# Patient Record
Sex: Male | Born: 1960 | Race: White | Hispanic: No | Smoking: Current every day smoker
Health system: Southern US, Community
[De-identification: ages and names within clinical notes are randomized; demographics above are authoritative.]

## PROBLEM LIST (undated history)

## (undated) DIAGNOSIS — N179 Acute kidney failure, unspecified: Secondary | ICD-10-CM

## (undated) DIAGNOSIS — M109 Gout, unspecified: Secondary | ICD-10-CM

## (undated) DIAGNOSIS — I10 Essential (primary) hypertension: Secondary | ICD-10-CM

## (undated) DIAGNOSIS — N451 Epididymitis: Secondary | ICD-10-CM

## (undated) DIAGNOSIS — K219 Gastro-esophageal reflux disease without esophagitis: Secondary | ICD-10-CM

## (undated) DIAGNOSIS — G8929 Other chronic pain: Secondary | ICD-10-CM

## (undated) DIAGNOSIS — M549 Dorsalgia, unspecified: Secondary | ICD-10-CM

## (undated) DIAGNOSIS — K859 Acute pancreatitis without necrosis or infection, unspecified: Secondary | ICD-10-CM

## (undated) DIAGNOSIS — F419 Anxiety disorder, unspecified: Secondary | ICD-10-CM

## (undated) HISTORY — DX: Anxiety disorder, unspecified: F41.9

---

## 1998-03-09 ENCOUNTER — Encounter: Payer: Self-pay | Admitting: *Deleted

## 1998-03-09 ENCOUNTER — Emergency Department (HOSPITAL_COMMUNITY): Admission: EM | Admit: 1998-03-09 | Discharge: 1998-03-09 | Payer: Self-pay | Admitting: Emergency Medicine

## 1998-03-11 ENCOUNTER — Ambulatory Visit (HOSPITAL_BASED_OUTPATIENT_CLINIC_OR_DEPARTMENT_OTHER): Admission: RE | Admit: 1998-03-11 | Discharge: 1998-03-11 | Payer: Self-pay | Admitting: Orthopedic Surgery

## 2000-07-09 ENCOUNTER — Emergency Department (HOSPITAL_COMMUNITY): Admission: EM | Admit: 2000-07-09 | Discharge: 2000-07-09 | Payer: Self-pay | Admitting: Emergency Medicine

## 2000-07-09 ENCOUNTER — Encounter: Payer: Self-pay | Admitting: Emergency Medicine

## 2000-07-14 ENCOUNTER — Ambulatory Visit (HOSPITAL_BASED_OUTPATIENT_CLINIC_OR_DEPARTMENT_OTHER): Admission: RE | Admit: 2000-07-14 | Discharge: 2000-07-15 | Payer: Self-pay | Admitting: Orthopedic Surgery

## 2000-08-19 ENCOUNTER — Encounter: Admission: RE | Admit: 2000-08-19 | Discharge: 2000-08-19 | Payer: Self-pay | Admitting: Orthopedic Surgery

## 2000-08-19 ENCOUNTER — Encounter: Payer: Self-pay | Admitting: Orthopedic Surgery

## 2006-10-16 ENCOUNTER — Emergency Department (HOSPITAL_COMMUNITY): Admission: EM | Admit: 2006-10-16 | Discharge: 2006-10-16 | Payer: Self-pay | Admitting: Emergency Medicine

## 2006-10-17 ENCOUNTER — Inpatient Hospital Stay (HOSPITAL_COMMUNITY): Admission: EM | Admit: 2006-10-17 | Discharge: 2006-10-19 | Payer: Self-pay | Admitting: Psychiatry

## 2006-10-19 ENCOUNTER — Ambulatory Visit: Payer: Self-pay | Admitting: Psychiatry

## 2007-07-31 ENCOUNTER — Emergency Department (HOSPITAL_COMMUNITY): Admission: EM | Admit: 2007-07-31 | Discharge: 2007-08-01 | Payer: Self-pay | Admitting: Emergency Medicine

## 2007-08-01 ENCOUNTER — Inpatient Hospital Stay (HOSPITAL_COMMUNITY): Admission: AD | Admit: 2007-08-01 | Discharge: 2007-08-04 | Payer: Self-pay | Admitting: Psychiatry

## 2007-08-01 ENCOUNTER — Ambulatory Visit: Payer: Self-pay | Admitting: Psychiatry

## 2007-08-13 ENCOUNTER — Emergency Department (HOSPITAL_COMMUNITY): Admission: EM | Admit: 2007-08-13 | Discharge: 2007-08-13 | Payer: Self-pay | Admitting: Emergency Medicine

## 2007-08-14 ENCOUNTER — Inpatient Hospital Stay (HOSPITAL_COMMUNITY): Admission: AD | Admit: 2007-08-14 | Discharge: 2007-08-18 | Payer: Self-pay | Admitting: Psychiatry

## 2010-02-23 ENCOUNTER — Emergency Department (HOSPITAL_COMMUNITY)
Admission: EM | Admit: 2010-02-23 | Discharge: 2010-02-23 | Payer: Self-pay | Source: Home / Self Care | Admitting: Emergency Medicine

## 2010-03-13 ENCOUNTER — Emergency Department (HOSPITAL_COMMUNITY)
Admission: EM | Admit: 2010-03-13 | Discharge: 2010-03-13 | Payer: Self-pay | Source: Home / Self Care | Admitting: Emergency Medicine

## 2010-08-18 ENCOUNTER — Ambulatory Visit (INDEPENDENT_AMBULATORY_CARE_PROVIDER_SITE_OTHER): Payer: Self-pay | Admitting: Urology

## 2010-08-18 DIAGNOSIS — N453 Epididymo-orchitis: Secondary | ICD-10-CM

## 2010-08-18 DIAGNOSIS — N433 Hydrocele, unspecified: Secondary | ICD-10-CM

## 2010-09-15 NOTE — H&P (Signed)
John Carroll, YELLOWHAIR NO.:  [id number]   MEDICAL RECORD NO.:  [id number]          PATIENT TYPE:  IPS   LOCATION:  0301                          FACILITY:  [facility]   PHYSICIAN:  Jasmine Pang, M.D. DATE OF BIRTH:  Feb 09, 1961   DATE OF ADMISSION:  08/14/2007  DATE OF DISCHARGE:                       PSYCHIATRIC ADMISSION ASSESSMENT   HISTORY OF PRESENT ILLNESS:  The patient presents with alcohol abuse and  dependence.  He has been drinking one pint, sometimes two, relapsed the  day of discharge.  He was here on August 02, 2007, for alcohol detox,  discharged approximately two weeks ago.  He states he passes out with  his drinking.  He was having some passive suicidal thoughts to end it  all.  Reports significant stressors of being unemployed.  He lost his  relationship and having financial difficulties.   PAST PSYCHIATRIC HISTORY:  The patient again was here less than two  weeks ago for alcohol dependence and relapsed the day of discharge.   SOCIAL HISTORY:  He is a single male who lives alone.  He is unemployed.  He has a court date pending for animal abuse.   FAMILY HISTORY:  Unknown.   ALCOHOL AND DRUG HISTORY:  The patient denies any seizures.  Denies any  blackouts.  Urine drug screen was positive for benzodiazepines, positive  for cannabis.   PRIMARY CARE Amirah Goerke:  [facility].   MEDICAL PROBLEMS:  Hypertension.   MEDICATIONS:  He is to be on Norvasc 10 mg and Lotensin 40 mg daily.  States he does not take his medications when he drinks.   DRUG ALLERGIES:  No known allergies.   PHYSICAL EXAMINATION:  This is a disheveled, middle-aged male that was  fully assessed at [facility] Emergency Department.  Temperature  is 97, 99 heart rate, 22 respirations, blood pressure is 163/106.  He is  144 pounds.  He is 5 feet, 8 inches tall.   He received 40 mEq of potassium chloride for a potassium level of 2.7  and magnesium oxide 400 mg.   OTHER  LABORATORY DATA:  CBC is within normal limits.  Again, potassium  2.7.  Alcohol level was 297 on arrival.  Urine drug screen was positive  for benzodiazepines, positive for THC.   MENTAL STATUS EXAM:  This is a disheveled male.  He is in no acute  distress.  Speech is clear, normal pace and tone.  The patient's mood is  depressed.  The patient's affect is he looks tired and appears somewhat  sad.  Thought processes are coherent.  No evidence of any psychosis.  No  delusional thinking.  Cognitive function intact.  His memory is good.  Judgment insight is fair.  Poor impulse control related to alcohol use.   AXIS I:  Alcohol dependence, polysubstance abuse.  AXIS II:  Deferred.  AXIS III:  Hypertension.  AXIS IV:  Problems with occupation, economic, other psychosocial  problems.  AXIS V:  Current is 35.   PLAN:  Detox patient on Librium protocol, work on relapse prevention.  Will continue to assess his other  comorbidities.  The patient may  benefit at this time for an antidepressant. Will encourage fluids.  Will  recheck his potassium level.  The patient is to attend the red group and  will continue identify his support group in follow-up.   TENTATIVE LENGTH OF STAY:  3-5 days.      Landry Corporal, N.P.      Jasmine Pang, M.D.  Electronically Signed    JO/MEDQ  D:  08/14/2007  T:  08/14/2007  Job:  [id number]

## 2010-09-15 NOTE — H&P (Signed)
NAMEHAYWARD, John               ACCOUNT NO.:  [id number]   MEDICAL RECORD NO.:  [id number]          PATIENT TYPE:  IPS   LOCATION:  0507                          FACILITY:  [facility]   PHYSICIAN:  Geoffery Lyons, M.D.      DATE OF BIRTH:  20-Jul-1960   DATE OF ADMISSION:  08/01/2007  DATE OF DISCHARGE:                       PSYCHIATRIC ADMISSION ASSESSMENT   IDENTIFICATION:  A 50 year old, white male, divorced.  This is a  voluntary admission.   HISTORY OF PRESENT ILLNESS:  The patient presented in the emergency room  requesting help getting off alcohol.  Acknowledge that he had been  drinking too much and said that he also has had recent symptoms of  decreased appetite and difficulty sleeping along with occasional chills  and sweats.  He had also had a confrontation with his girlfriend related  to his alcohol and she said she did not want to see him unless he had  stopped drinking.  He reports that he is currently drinking at least a  pint a day and says that he pretty much comes home after work, drinks  and falls asleep.  He recognizes that it is affecting his relationship  and is requesting detox.  Denies suicidal thoughts.  The patient feels  that he relapsed after 9 months of abstinence after he became divorced,  lost his home situation and was put on short-time work schedule with  additional idle time on his hands.  He says he got depressed, was  feeling sorry for himself, took 1 drink and then could no stop.   PAST PSYCHIATRIC HISTORY:  Second [facility] admission with prior admission  October 17, 2006, to October 19, 2006, also for alcohol detox.  Reports that  he has been drinking alcohol since the age of 33.  Recently after his  previous admission, he was able to remain abstinent for about 9 months.  No known history of suicide attempts.   SOCIAL HISTORY:  The patient has a basic education and has worked for  many years at [employer], which is stable employment for him.  He has a  fiance that he has been living with who is a nondrinker and  would be supportive of him abstaining.  He has a daughter who is grown  and lives independently.  No legal problems.   FAMILY HISTORY:  No family history of addictions or mental illness.   ALCOHOL AND DRUG HISTORY:  In addition to alcohol noted above, he  endorses rare use of marijuana.  The patient is followed at [facility] by a Dr. Frazier Richards.  Medical problems are  hypertension.   MEDICATIONS:  1. Benicar 20 mg daily.  2. Benazepril 40 mg daily.  3. Amlodipine 10 mg daily.   ALLERGIES:  No known drug allergies.   PAST MEDICAL HISTORY:  Significant for no history of seizures, head  injury or brain injury.  History of blackouts is unclear.   PHYSICAL EXAMINATION:  VITAL SIGNS:  The patient presented to the  emergency room tachycardic with pulse 101, afebrile with blood pressure  149/106.  Full  physical exam is noted in the record.  He was stabilized  there and received 100 mg of thiamine and a banana bag of fluids.  On  presentation to our unit, temperature 99.1, pulse 128, respirations 14,  blood pressure 176/100.  He is 5 feet 7 inches tall, 143 pounds.   LABORATORY DATA AND X-RAY FINDINGS:  Diagnostic studies were done in the  emergency room.  CBC with WBC 9.6, hemoglobin 16, crit 45.1, platelets  245,000.  Chemistry with sodium 144, potassium 3.0 and then repleted to  3.4, chloride 98, carbon dioxide 32, BUN 4, creatinine 0.8 and random  glucose was 123.  Liver enzymes with SGOT 27, SGPT 17, alkaline  phosphatase 106 and total bilirubin 0.8, calcium 8.9, albumin 3.2.  Initial alcohol level was 422.  Urine drug screen was positive for  tetrahydrocannabinols.   MENTAL STATUS EXAM:  A fully alert gentleman.  He is coherent today and  oriented x3.  He gives a coherent history.  Insight is good.  Recognizes  factors leading up to his relapse of increased idle time, work being cut  back.  He  says that he was feeling sorry for himself and would like to  return to AA meetings because he feels that the companionship in AA  helped him maintain 9 months of abstinence.  Speech is normal.  Mood is  neutral today.  Thought process logical and coherent.  No evidence of  delirium or confusion.  No paranoia, flight of ideas or symptoms of  psychosis.  He is denying any suicidal thoughts.  Cognition is intact to  person, place and situation.  Immediate, recent and remote memory are  intact.  Insight is adequate.  Impulse control and judgment within  normal limits.   ADMISSION DIAGNOSES:  AXIS I:  Alcohol abuse, rule out dependence.  AXIS II:  Deferred.  AXIS III:  Hypertension.  AXIS IV:  Moderate to severe; relationship issues with current  relationship.  AXIS V:  Current 46; past year not known.   PLAN:  Plan is to voluntarily admit the patient for safe detox within 5  days.  We started him on a Librium protocol with tapering dose of 25 mg  and additional medications for withdrawal symptoms.  He will also  receive thiamine 100 mg daily and folic acid 1 mg daily.  We will  restart his routine blood pressure medications which have been validated  with [facility].  He is enrolled in our dual diagnosis  program and has been active in groups today.  Estimated length of stay  is 5 days.      Margaret A. Scott, N.P.      Geoffery Lyons, M.D.  Electronically Signed    MAS/MEDQ  D:  08/02/2007  T:  08/02/2007  Job:  [id number]

## 2010-09-18 NOTE — Discharge Summary (Signed)
John Carroll, John Carroll               ACCOUNT NO.:  [id number]   MEDICAL RECORD NO.:  [id number]          PATIENT TYPE:  IPS   LOCATION:  0507                          FACILITY:  [facility]   PHYSICIAN:  Geoffery Lyons, M.D.      DATE OF BIRTH:  Apr 12, 1961   DATE OF ADMISSION:  08/01/2007  DATE OF DISCHARGE:  08/04/2007                               DISCHARGE SUMMARY   CHIEF COMPLAINT/HISTORY OF PRESENT ILLNESS:  This was the second  admission to [facility] for this 50 year old white  male, divorced.  Voluntarily admitted.  Presented to the ED requesting  help getting off alcohol.  Originally had been drinking too much and  said that he also has had recent symptoms of decreased appetite,  difficulty sleeping along with occasional chills and sweats.  Also had a  confrontation with his girlfriend related to his alcohol.  She said that  she did not want to see him unless he stopped drinking.  He is currently  drinking at least a pint a day.  Pretty much comes home after work,  drinks and falls asleep.  It is affecting his relationships.  Endorsed  he relapsed after 9 months of abstinence after he divorced, lost his  home situation and was put on a short-time work schedule with additional  idle time on his hands.  He got depressed, was feeling sorry for  himself.   PAST PSYCHIATRIC HISTORY:  Second time at [facility].  He was  admitted June 2008 for alcohol detox.   ALCOHOL AND DRUG HISTORY:  Has been drinking alcohol since age 89.  After his last admission, he was able to abstain for 9 months.  Does  admit to rare use of marijuana.   MEDICAL HISTORY:  Hypertension.   MEDICATIONS:  Benicar 20 mg per day, benazepril 40 mg per day, Norvasc  10 mg per day.   PHYSICAL EXAMINATION:  Exam failed to show any acute findings.   LABORATORY WORK:  CBC:  White blood cells 9.6, hemoglobin 16, sodium  144, potassium 3.0, replaced to 3.4, BUN 4, creatinine 0.8, glucose 123,  SGOT  27, SGPT 17, total bilirubin 0.8.  Initial alcohol level was 422.  UDS was positive for marijuana.   MENTAL STATUS EXAM:  Reveals a fully alert cooperative male.  His mood  is depressed.  Affect is depressed.  Thought processes logical, coherent  and relevant.  Endorsed he was overwhelmed.  He wants to detox.  He  wants to maintain abstinence.  No evidence of delusions.  No suicidal or  homicidal ideas.  No hallucinations.  Cognition well-preserved.   ADMISSION DIAGNOSES:  AXIS I:  Alcohol dependence.  Depressive disorder  not otherwise specified.  AXIS II:  No diagnosis.  AXIS III:  Hypertension.  AXIS IV:  Moderate.  AXIS V:  On admission 40, highest GAF in the last year 60.   COURSE IN THE HOSPITAL:  He was admitted.  He was started in individual  and group psychotherapy, was detoxified with Librium.  He was given  trazodone.  He was maintained on his  blood pressure medications.  Did  endorse that the use of alcohol was causing a lot of stress with the  fiancee.  Admitted that the factors for relapse were the divorce, losing  his house, slow time at job.  By April 2, endorsed that the withdrawal  was going better than what he expected, only minor symptoms.  Did want  to look into going to a residential treatment center.  Concerned about  being able to make it if he was not in a structured facility.  On April  3 he was in full contact with reality.  Fully detoxed.  He was going to  look into going into [facility].  He was going to ask family for  help.  He was in full contact with reality.  No suicidal or homicidal  ideas, no hallucinations or delusions.  Was discharged to [facility] or outpatient treatment through [facility].   DISCHARGE DIAGNOSES:  AXIS I:  Alcohol dependence.  AXIS II:  No diagnosis.  AXIS III:  Hypertension.  AXIS IV:  Moderate.  AXIS V:  Upon discharge, 50, 55.   Discharged on Norvasc 10 mg per day, benazepril 40 mg, Benicar 20 mg per   day, trazodone 50 at bedtime for sleep.   Follow up at [facility].      Geoffery Lyons, M.D.  Electronically Signed     IL/MEDQ  D:  09/06/2007  T:  09/06/2007  Job:  [id number]

## 2010-09-18 NOTE — Op Note (Signed)
[place]  Patient:    John Carroll, John Carroll                      MRN: [id number] Proc. Date: 07/14/00 Adm. Date:  [id number] Attending:  Teena Dunk                           Operative Report  PREOPERATIVE DIAGNOSIS:  Dislocation of ankle with rupture of deltoid and syndesmosis.  POSTOPERATIVE DIAGNOSIS:  Dislocation of ankle with rupture of deltoid and syndesmosis.  OPERATION PERFORMED:  Open reduction internal fixation of syndesmosis and deltoid ligament.  SURGEON:  Sharlot Gowda., M.D.  ANESTHESIA:  General.  INDICATIONS FOR PROCEDURE:  This 50 year old slipped in a hole, disruption of the deltoid syndesmosis thought to be amenable to outpatient.  TOURNIQUET TIME:  Approximately one hour.  DESCRIPTION OF PROCEDURE:  The foot was manipulation.  There was complete instability and tear of the deltoid with a small avulsion fracture.  Two 45 screws were put through four cortices that includes the tibia and fibula. Initially confirmed that the screws initially just ____________ the posterior cortex of the tibia.  They were replaced to be well within the tibia on both the AP lateral and mortise views.  The mortise ____________ with really good resolution of the deltoid tear and closing of the syndesmosis.  Two 45 screws were placed, one with a washer and one without.  The wound was irrigated and closed with 2-0 Vicryl and skin clips.  Lightly compressive sterile dressing, posterior plaster splint applied with a bulky dressing.  Taken to recovery room in stable condition. DD:  07/14/00 TD:  07/14/00 Job: [id number]

## 2010-11-17 ENCOUNTER — Ambulatory Visit: Payer: Self-pay | Admitting: Urology

## 2011-01-25 LAB — HEPATIC FUNCTION PANEL
ALT: 17
AST: 27
Bilirubin, Direct: 0.1
Indirect Bilirubin: 0.7
Total Protein: 6.7

## 2011-01-25 LAB — DIFFERENTIAL
Basophils Absolute: 0
Eosinophils Absolute: 0
Eosinophils Relative: 0
Monocytes Absolute: 0.6

## 2011-01-25 LAB — CBC
HCT: 45.1
Hemoglobin: 16
MCHC: 35.4
MCV: 96.4
Platelets: 245
RDW: 14.4

## 2011-01-25 LAB — BASIC METABOLIC PANEL
BUN: 4 — ABNORMAL LOW
CO2: 32
Chloride: 98
Glucose, Bld: 123 — ABNORMAL HIGH
Potassium: 3 — ABNORMAL LOW
Sodium: 144

## 2011-01-25 LAB — ETHANOL
Alcohol, Ethyl (B): 186 — ABNORMAL HIGH
Alcohol, Ethyl (B): 270 — ABNORMAL HIGH
Alcohol, Ethyl (B): 422

## 2011-01-25 LAB — RAPID URINE DRUG SCREEN, HOSP PERFORMED
Barbiturates: NOT DETECTED
Benzodiazepines: NOT DETECTED

## 2011-01-26 LAB — DIFFERENTIAL
Basophils Absolute: 0
Basophils Relative: 1
Eosinophils Absolute: 0.1
Eosinophils Relative: 1
Lymphocytes Relative: 38
Lymphs Abs: 3.1
Monocytes Absolute: 0.8
Monocytes Relative: 10
Neutro Abs: 4.1
Neutrophils Relative %: 51

## 2011-01-26 LAB — COMPREHENSIVE METABOLIC PANEL
AST: 26
Albumin: 3.6
Alkaline Phosphatase: 110
CO2: 30
Chloride: 102
GFR calc Af Amer: >60
GFR calc non Af Amer: >60
Potassium: 2.9 — ABNORMAL LOW
Total Bilirubin: 0.8

## 2011-01-26 LAB — CBC
HCT: 44.9
Hemoglobin: 15.7
MCHC: 34.9
MCV: 97.3
Platelets: 435 — ABNORMAL HIGH
RBC: 4.61
RDW: 13.9
WBC: 8

## 2011-01-26 LAB — POCT I-STAT, CHEM 8
BUN: 4 — ABNORMAL LOW
Calcium, Ion: 1.06 — ABNORMAL LOW
Chloride: 101
Creatinine, Ser: 1.1
Glucose, Bld: 104 — ABNORMAL HIGH
HCT: 49
Hemoglobin: 16.7
Potassium: 2.7 — CL
Sodium: 143
TCO2: 31

## 2011-01-26 LAB — HEPATIC FUNCTION PANEL
AST: 35
Bilirubin, Direct: 0.3
Total Protein: 8.1

## 2011-01-26 LAB — RAPID URINE DRUG SCREEN, HOSP PERFORMED
Cocaine: NOT DETECTED
Tetrahydrocannabinol: POSITIVE — AB

## 2011-01-26 LAB — ETHANOL: Alcohol, Ethyl (B): 297 — ABNORMAL HIGH

## 2011-01-26 LAB — LIPASE, BLOOD: Lipase: 26

## 2011-01-26 LAB — POTASSIUM: Potassium: 2.9 — ABNORMAL LOW

## 2011-02-17 LAB — COMPREHENSIVE METABOLIC PANEL
Albumin: 3.4 — ABNORMAL LOW
BUN: 3 — ABNORMAL LOW
Calcium: 8.8
Creatinine, Ser: 0.84
Total Bilirubin: 3 — ABNORMAL HIGH
Total Protein: 7.2

## 2011-02-17 LAB — POTASSIUM: Potassium: 2.5 — CL

## 2011-02-17 LAB — HEPATIC FUNCTION PANEL
AST: 422 — ABNORMAL HIGH
Albumin: 3.1 — ABNORMAL LOW
Alkaline Phosphatase: 220 — ABNORMAL HIGH
Total Bilirubin: 2.6 — ABNORMAL HIGH
Total Protein: 6.8

## 2011-02-17 LAB — I-STAT 8, (EC8 V) (CONVERTED LAB)
Chloride: 92 — ABNORMAL LOW
Glucose, Bld: 148 — ABNORMAL HIGH
Potassium: 2.7 — CL
TCO2: 40
pCO2, Ven: 41.7 — ABNORMAL LOW
pH, Ven: 7.574 — ABNORMAL HIGH

## 2011-02-17 LAB — RAPID URINE DRUG SCREEN, HOSP PERFORMED
Amphetamines: NOT DETECTED
Barbiturates: NOT DETECTED
Cocaine: NOT DETECTED
Opiates: NOT DETECTED

## 2011-02-17 LAB — BASIC METABOLIC PANEL
Calcium: 9.2
GFR calc Af Amer: >60
GFR calc non Af Amer: >60
Sodium: 138

## 2011-02-17 LAB — POCT I-STAT CREATININE: Creatinine, Ser: 1.1

## 2012-02-18 ENCOUNTER — Emergency Department (HOSPITAL_COMMUNITY): Payer: Self-pay

## 2012-02-18 ENCOUNTER — Emergency Department (HOSPITAL_COMMUNITY)
Admission: EM | Admit: 2012-02-18 | Discharge: 2012-02-18 | Disposition: A | Discharge status: Home / Self Care | Payer: Self-pay | Attending: Emergency Medicine | Admitting: Emergency Medicine

## 2012-02-18 ENCOUNTER — Encounter (HOSPITAL_COMMUNITY): Payer: Self-pay | Admitting: *Deleted

## 2012-02-18 DIAGNOSIS — N433 Hydrocele, unspecified: Secondary | ICD-10-CM | POA: Insufficient documentation

## 2012-02-18 LAB — URINE MICROSCOPIC-ADD ON

## 2012-02-18 LAB — URINALYSIS, ROUTINE W REFLEX MICROSCOPIC
Glucose, UA: NEGATIVE mg/dL
Ketones, ur: NEGATIVE mg/dL
Leukocytes, UA: NEGATIVE
Protein, ur: 30 mg/dL — AB
pH: 5.5 (ref 5.0–8.0)

## 2012-02-18 MED ORDER — ONDANSETRON HCL 4 MG PO TABS
4.0000 mg | ORAL_TABLET | Freq: Once | ORAL | Status: AC
  Administered 2012-02-18: 4 mg via ORAL
  Filled 2012-02-18: qty 1

## 2012-02-18 MED ORDER — CIPROFLOXACIN HCL 500 MG PO TABS: 500.0000 mg | ORAL_TABLET | Freq: Two times a day (BID) | ORAL | Status: DC

## 2012-02-18 MED ORDER — CIPROFLOXACIN HCL 250 MG PO TABS
500.0000 mg | ORAL_TABLET | Freq: Once | ORAL | Status: AC
  Administered 2012-02-18: 500 mg via ORAL
  Filled 2012-02-18: qty 2

## 2012-02-18 MED ORDER — MORPHINE SULFATE 4 MG/ML IJ SOLN: 8.0000 mg | Freq: Once | INTRAMUSCULAR | Status: AC

## 2012-02-18 MED ORDER — MORPHINE SULFATE 10 MG/ML IJ SOLN
INTRAMUSCULAR | Status: AC
  Administered 2012-02-18: 8 mg
  Filled 2012-02-18: qty 1

## 2012-02-18 MED ORDER — HYDROCODONE-ACETAMINOPHEN 7.5-325 MG PO TABS: 1.0000 | ORAL_TABLET | ORAL | Status: AC | PRN

## 2012-02-18 NOTE — ED Notes (Signed)
Pain , swelling of lt testicle, no dysuria, no fever.  Rt ear pain, with decreased hearing.

## 2012-02-20 LAB — URINE CULTURE

## 2012-02-25 NOTE — ED Provider Notes (Signed)
History     CSN: [id number]  Arrival date & time 02/18/12  1054   First MD Initiated Contact with Patient 02/18/12 1106      Chief Complaint  Patient presents with  . Testicle Pain    (Consider location/radiation/quality/duration/timing/severity/associated sxs/prior treatment) Patient is a 51 y.o. male presenting with testicular pain. The history is provided by the patient.  Testicle Pain This is a new problem. The current episode started in the past 7 days. The problem occurs daily. The problem has been gradually worsening. Pertinent negatives include no abdominal pain, anorexia, arthralgias, chest pain, chills, coughing, fever, nausea, neck pain or vomiting. Nothing aggravates the symptoms. He has tried nothing for the symptoms. The treatment provided no relief.    History reviewed. No pertinent past medical history.  Past Surgical History  Procedure Date  . Epidymitis     History reviewed. No pertinent family history.  History  Substance Use Topics  . Smoking status: Current Every Day Smoker  . Smokeless tobacco: Not on file  . Alcohol Use: Yes      Review of Systems  Constitutional: Negative for fever, chills and activity change.       All ROS Neg except as noted in HPI  HENT: Negative for nosebleeds and neck pain.   Eyes: Negative for photophobia and discharge.  Respiratory: Negative for cough, shortness of breath and wheezing.   Cardiovascular: Negative for chest pain and palpitations.  Gastrointestinal: Negative for nausea, vomiting, abdominal pain, blood in stool and anorexia.  Genitourinary: Positive for testicular pain. Negative for dysuria, frequency and hematuria.  Musculoskeletal: Negative for back pain and arthralgias.  Skin: Negative.   Neurological: Negative for dizziness, seizures and speech difficulty.  Psychiatric/Behavioral: Negative for hallucinations and confusion.    Allergies  Review of patient's allergies indicates no known  allergies.  Home Medications   Current Outpatient Rx  Name Route Sig Dispense Refill  . CIPROFLOXACIN HCL 500 MG PO TABS Oral Take 1 tablet (500 mg total) by mouth 2 (two) times daily. 28 tablet 0  . HYDROCODONE-ACETAMINOPHEN 7.5-325 MG PO TABS Oral Take 1 tablet by mouth every 4 (four) hours as needed for pain. 20 tablet 0    BP 151/93  Pulse 95  Temp 98.8 F (37.1 C) (Oral)  Resp 20  Ht 5\' 9"  (1.753 m)  Wt 180 lb (81.647 kg)  BMI 26.58 kg/m2  SpO2 93%  Physical Exam  Nursing note and vitals reviewed. Constitutional: He is oriented to person, place, and time. He appears well-developed and well-nourished.  Non-toxic appearance.  HENT:  Head: Normocephalic.  Right Ear: Tympanic membrane and external ear normal.  Left Ear: Tympanic membrane and external ear normal.  Eyes: EOM and lids are normal. Pupils are equal, round, and reactive to light.  Neck: Normal range of motion. Neck supple. Carotid bruit is not present.  Cardiovascular: Normal rate, regular rhythm, normal heart sounds, intact distal pulses and normal pulses.   Pulmonary/Chest: Breath sounds normal. No respiratory distress.  Abdominal: Soft. Bowel sounds are normal. There is no tenderness. There is no guarding.  Genitourinary:       Few palpable inguinal nodes present. No pain or rash or discharge of the penis. The left testicle is swollen and tender. No swelling of the right testicle. No perianal mass or tenderness. No spincter abnormality.  Musculoskeletal: Normal range of motion.  Lymphadenopathy:       Head (right side): No submandibular adenopathy present.       Head (  left side): No submandibular adenopathy present.    He has no cervical adenopathy.  Neurological: He is alert and oriented to person, place, and time. He has normal strength. No cranial nerve deficit or sensory deficit.  Skin: Skin is warm and dry.  Psychiatric: He has a normal mood and affect. His speech is normal.    ED Course  Procedures  (including critical care time)  Labs Reviewed  URINALYSIS, ROUTINE W REFLEX MICROSCOPIC - Abnormal; Notable for the following:    Color, Urine AMBER (*)  BIOCHEMICALS MAY BE AFFECTED BY COLOR   Specific Gravity, Urine >1.030 (*)     Hgb urine dipstick SMALL (*)     Bilirubin Urine SMALL (*)     Protein, ur 30 (*)     All other components within normal limits  URINE MICROSCOPIC-ADD ON - Abnormal; Notable for the following:    Bacteria, UA FEW (*)     All other components within normal limits  URINE CULTURE  LAB REPORT - SCANNED   No results found.   1. Hydrocele, left       MDM  I have reviewed nursing notes, vital signs, and all appropriate lab and imaging results for this patient. UA wnl except for few bacteria. Ultrasound of the scrotum reveal left hydrocele, and scrotal wall thickening. Pt referred to urology. Rx for cipro and norco 7.5 given to the patient.       Kathie Dike, PA 02/25/12 [address] [place], [place] 03/03/12 1810

## 2012-03-03 NOTE — ED Provider Notes (Signed)
Medical screening examination/treatment/procedure(s) were performed by non-physician practitioner and as supervising physician I was immediately available for consultation/collaboration.   Joya Gaskins, MD 03/03/12 [phone]

## 2012-12-02 ENCOUNTER — Emergency Department (HOSPITAL_COMMUNITY)
Admission: EM | Admit: 2012-12-02 | Discharge: 2012-12-02 | Disposition: A | Discharge status: Home / Self Care | Payer: Self-pay | Attending: Emergency Medicine | Admitting: Emergency Medicine

## 2012-12-02 ENCOUNTER — Encounter (HOSPITAL_COMMUNITY): Payer: Self-pay | Admitting: Emergency Medicine

## 2012-12-02 DIAGNOSIS — F17200 Nicotine dependence, unspecified, uncomplicated: Secondary | ICD-10-CM | POA: Insufficient documentation

## 2012-12-02 DIAGNOSIS — Z79899 Other long term (current) drug therapy: Secondary | ICD-10-CM | POA: Insufficient documentation

## 2012-12-02 DIAGNOSIS — I10 Essential (primary) hypertension: Secondary | ICD-10-CM | POA: Insufficient documentation

## 2012-12-02 DIAGNOSIS — N453 Epididymo-orchitis: Secondary | ICD-10-CM | POA: Insufficient documentation

## 2012-12-02 DIAGNOSIS — N451 Epididymitis: Secondary | ICD-10-CM

## 2012-12-02 HISTORY — DX: Essential (primary) hypertension: I10

## 2012-12-02 LAB — URINALYSIS, ROUTINE W REFLEX MICROSCOPIC
Glucose, UA: NEGATIVE mg/dL
Leukocytes, UA: NEGATIVE
Nitrite: NEGATIVE
Specific Gravity, Urine: 1.02 (ref 1.005–1.030)
pH: 7 (ref 5.0–8.0)

## 2012-12-02 LAB — URINE MICROSCOPIC-ADD ON

## 2012-12-02 MED ORDER — CIPROFLOXACIN HCL 500 MG PO TABS: 500.0000 mg | ORAL_TABLET | Freq: Two times a day (BID) | ORAL | Status: DC

## 2012-12-02 MED ORDER — DOXYCYCLINE HYCLATE 100 MG PO CAPS: 100.0000 mg | ORAL_CAPSULE | Freq: Two times a day (BID) | ORAL | Status: DC

## 2012-12-02 MED ORDER — METOPROLOL TARTRATE 50 MG PO TABS: 50.0000 mg | ORAL_TABLET | Freq: Two times a day (BID) | ORAL | Status: DC

## 2012-12-02 NOTE — ED Notes (Signed)
Pt states that he has had problems with epidymidis for numerous years, usually on left side, right testicle started having pain a week ago,

## 2012-12-02 NOTE — ED Provider Notes (Signed)
CSN: [id number]     Arrival date & time 12/02/12  4098 History    This chart was scribed for No att. providers found,  by Ashley Jacobs, ED Scribe. The patient was seen in room APOTF/OTF and the patient's care was started at 9:57 AM.  Chief Complaint  Patient presents with  . Testicle Pain   (Consider location/radiation/quality/duration/timing/severity/associated sxs/prior Treatment) The history is provided by the patient and medical records. No language interpreter was used.   HPI Comments: John Carroll is a 52 y.o. male who presents to the Emergency Department complaining of gradual onset worsening right moderate testicular pain for the past week. Pt has chronic bilateral epididymitis for 20 years with an episode once or twice a year lasts 1-2 weeks. For previous episode pt has taken antibiotics with relief. Pt reports that symptoms are relieved with urination and nothing seems to worsens the pain. Pt reports smoking and alcohol use. No trauma, fever, colicky pain, abdominal pain, N/V/D. Does not need narcotics for his moderate pain. Pain like prior episodes.    Past Medical History  Diagnosis Date  . Hypertension    Past Surgical History  Procedure Laterality Date  . Epidymitis     No family history on file. History  Substance Use Topics  . Smoking status: Current Every Day Smoker -- 1.00 packs/day    Types: Cigarettes  . Smokeless tobacco: Not on file  . Alcohol Use: Yes    Review of Systems  Constitutional: Negative for fever and activity change.  Respiratory: Negative for shortness of breath.   Gastrointestinal: Negative for nausea, vomiting, abdominal pain, diarrhea and blood in stool.  Genitourinary: Negative for dysuria, frequency, hematuria, discharge and difficulty urinating.  Musculoskeletal: Negative for back pain.  Skin: Negative for rash.  Hematological: Negative for adenopathy.  Psychiatric/Behavioral: Negative for hallucinations, confusion and sleep  disturbance.  All other systems reviewed and are negative.  A complete 10 system review of systems was obtained and all systems are negative except as noted in the HPI and PMH.   Allergies  Review of patient's allergies indicates no known allergies.  Home Medications   Current Outpatient Rx  Name  Route  Sig  Dispense  Refill  . HYDROcodone-acetaminophen (NORCO/VICODIN) 5-325 MG per tablet   Oral   Take 1 tablet by mouth daily as needed for pain.         . ciprofloxacin (CIPRO) 500 MG tablet   Oral   Take 1 tablet (500 mg total) by mouth 2 (two) times daily. One po bid x 7 days   14 tablet   0   . metoprolol (LOPRESSOR) 50 MG tablet   Oral   Take 1 tablet (50 mg total) by mouth 2 (two) times daily.   60 tablet   0    BP 209/110  Pulse 87  Temp(Src) 98.9 F (37.2 C) (Oral)  Resp 20  Ht 5\' 10"  (1.778 m)  Wt 190 lb (86.183 kg)  BMI 27.26 kg/m2  SpO2 96% Physical Exam  Nursing note and vitals reviewed. Constitutional:  Awake, alert, nontoxic appearance.  HENT:  Head: Atraumatic.  Eyes: Right eye exhibits no discharge. Left eye exhibits no discharge.  Neck: Neck supple.  Cardiovascular: Normal rate, regular rhythm and normal heart sounds.   No murmur heard. Pulmonary/Chest: Effort normal and breath sounds normal. No respiratory distress. He has no wheezes. He has no rales. He exhibits no tenderness.  Abdominal: Soft. Bowel sounds are normal. He exhibits no mass. There  is no tenderness. There is no rebound and no guarding.  Genitourinary:   No Inguinal hernias No inguinal LAN L testicle non tender R inferior testicle non tender  Right testicle superior/posterior aspect testicular tenderness consistent with epididymitis No penile discharge No scrotal rash   Musculoskeletal: He exhibits no edema and no tenderness.  Baseline ROM, no obvious new focal weakness. No CVA tenderness  Neurological: He is alert.  Mental status and motor strength appears baseline for  patient and situation.  Skin: No rash noted.  Psychiatric: He has a normal mood and affect.    ED Course  DIAGNOSTIC STUDIES: Oxygen Saturation is 96% on room air, normal by my interpretation.    COORDINATION OF CARE: 10:02 AM Patient / Family / Caregiver understand and agree with initial ED impression and plan with expectations set for ED visit. Patient / Family / Caregiver informed of clinical course, understand medical decision-making process, and agree with plan.  Procedures (including critical care time)  Labs Reviewed  URINALYSIS, ROUTINE W REFLEX MICROSCOPIC - Abnormal; Notable for the following:    Protein, ur TRACE (*)    All other components within normal limits  URINE MICROSCOPIC-ADD ON   No results found. 1. Epididymitis, right   2. Hypertension     MDM  I doubt any other EMC precluding discharge at this time including, but not necessarily limited to the following:torsion. I personally performed the services described in this documentation, which was scribed in my presence. The recorded information has been reviewed and is accurate.     Hurman Horn, MD 12/02/12 [phone]

## 2012-12-02 NOTE — ED Notes (Signed)
Pt states that he used to be on blood pressure medication years ago but has not taken any medication in a long time due to losing his job and insurance.

## 2012-12-02 NOTE — ED Notes (Signed)
Pt states he has right testicle pain/swelling x 1 week. States h/s reoccurring epididymitis.

## 2016-03-28 ENCOUNTER — Inpatient Hospital Stay (HOSPITAL_COMMUNITY)
Admission: EM | Admit: 2016-03-28 | Discharge: 2016-03-31 | DRG: 683 | Disposition: A | Discharge status: Home / Self Care | Payer: Medicaid Other | Attending: Internal Medicine | Admitting: Internal Medicine

## 2016-03-28 ENCOUNTER — Emergency Department (HOSPITAL_COMMUNITY): Payer: Medicaid Other

## 2016-03-28 ENCOUNTER — Encounter (HOSPITAL_COMMUNITY): Payer: Self-pay | Admitting: Emergency Medicine

## 2016-03-28 DIAGNOSIS — Z833 Family history of diabetes mellitus: Secondary | ICD-10-CM | POA: Diagnosis not present

## 2016-03-28 DIAGNOSIS — Z79899 Other long term (current) drug therapy: Secondary | ICD-10-CM | POA: Diagnosis not present

## 2016-03-28 DIAGNOSIS — F17210 Nicotine dependence, cigarettes, uncomplicated: Secondary | ICD-10-CM | POA: Diagnosis present

## 2016-03-28 DIAGNOSIS — E860 Dehydration: Secondary | ICD-10-CM | POA: Diagnosis present

## 2016-03-28 DIAGNOSIS — Z841 Family history of disorders of kidney and ureter: Secondary | ICD-10-CM

## 2016-03-28 DIAGNOSIS — N179 Acute kidney failure, unspecified: Secondary | ICD-10-CM | POA: Diagnosis not present

## 2016-03-28 DIAGNOSIS — E878 Other disorders of electrolyte and fluid balance, not elsewhere classified: Secondary | ICD-10-CM | POA: Diagnosis present

## 2016-03-28 DIAGNOSIS — I10 Essential (primary) hypertension: Secondary | ICD-10-CM | POA: Diagnosis present

## 2016-03-28 DIAGNOSIS — E872 Acidosis: Secondary | ICD-10-CM | POA: Diagnosis present

## 2016-03-28 DIAGNOSIS — G8929 Other chronic pain: Secondary | ICD-10-CM | POA: Diagnosis present

## 2016-03-28 DIAGNOSIS — E871 Hypo-osmolality and hyponatremia: Secondary | ICD-10-CM | POA: Diagnosis not present

## 2016-03-28 DIAGNOSIS — D649 Anemia, unspecified: Secondary | ICD-10-CM | POA: Diagnosis present

## 2016-03-28 DIAGNOSIS — N189 Chronic kidney disease, unspecified: Secondary | ICD-10-CM

## 2016-03-28 DIAGNOSIS — Z8249 Family history of ischemic heart disease and other diseases of the circulatory system: Secondary | ICD-10-CM | POA: Diagnosis not present

## 2016-03-28 DIAGNOSIS — M62830 Muscle spasm of back: Secondary | ICD-10-CM

## 2016-03-28 DIAGNOSIS — E875 Hyperkalemia: Secondary | ICD-10-CM | POA: Diagnosis present

## 2016-03-28 DIAGNOSIS — I959 Hypotension, unspecified: Secondary | ICD-10-CM | POA: Diagnosis present

## 2016-03-28 HISTORY — DX: Other chronic pain: G89.29

## 2016-03-28 HISTORY — DX: Dorsalgia, unspecified: M54.9

## 2016-03-28 HISTORY — DX: Epididymitis: N45.1

## 2016-03-28 LAB — CBC WITH DIFFERENTIAL/PLATELET
BASOS PCT: 0 %
Basophils Absolute: 0 10*3/uL (ref 0.0–0.1)
EOS PCT: 0 %
Eosinophils Absolute: 0 10*3/uL (ref 0.0–0.7)
HEMATOCRIT: 42.8 % (ref 39.0–52.0)
Hemoglobin: 14.8 g/dL (ref 13.0–17.0)
Lymphocytes Relative: 7 %
Lymphs Abs: 0.7 10*3/uL (ref 0.7–4.0)
MCH: 31.6 pg (ref 26.0–34.0)
MCHC: 34.6 g/dL (ref 30.0–36.0)
MCV: 91.5 fL (ref 78.0–100.0)
MONO ABS: 0.4 10*3/uL (ref 0.1–1.0)
MONOS PCT: 4 %
NEUTROS ABS: 9 10*3/uL — AB (ref 1.7–7.7)
Neutrophils Relative %: 89 %
Platelets: 187 10*3/uL (ref 150–400)
RBC: 4.68 MIL/uL (ref 4.22–5.81)
RDW: 13.7 % (ref 11.5–15.5)
WBC: 10.1 10*3/uL (ref 4.0–10.5)

## 2016-03-28 LAB — URINALYSIS, ROUTINE W REFLEX MICROSCOPIC
BILIRUBIN URINE: NEGATIVE
Glucose, UA: NEGATIVE mg/dL
Ketones, ur: NEGATIVE mg/dL
Leukocytes, UA: NEGATIVE
NITRITE: NEGATIVE
SPECIFIC GRAVITY, URINE: 1.015 (ref 1.005–1.030)
pH: 5.5 (ref 5.0–8.0)

## 2016-03-28 LAB — CREATININE, URINE, RANDOM: CREATININE, URINE: 63.05 mg/dL

## 2016-03-28 LAB — URINE MICROSCOPIC-ADD ON

## 2016-03-28 LAB — I-STAT CHEM 8, ED
BUN: 121 mg/dL — AB (ref 6–20)
CALCIUM ION: 1.08 mmol/L — AB (ref 1.15–1.40)
CREATININE: 6.3 mg/dL — AB (ref 0.61–1.24)
Chloride: 99 mmol/L — ABNORMAL LOW (ref 101–111)
Glucose, Bld: 108 mg/dL — ABNORMAL HIGH (ref 65–99)
HEMATOCRIT: 47 % (ref 39.0–52.0)
Hemoglobin: 16 g/dL (ref 13.0–17.0)
Potassium: 6.8 mmol/L (ref 3.5–5.1)
Sodium: 125 mmol/L — ABNORMAL LOW (ref 135–145)
TCO2: 19 mmol/L (ref 0–100)

## 2016-03-28 LAB — BASIC METABOLIC PANEL
ANION GAP: 13 (ref 5–15)
Anion gap: 10 (ref 5–15)
BUN: 108 mg/dL — AB (ref 6–20)
BUN: 102 mg/dL — ABNORMAL HIGH (ref 6–20)
CALCIUM: 9.1 mg/dL (ref 8.9–10.3)
CHLORIDE: 106 mmol/L (ref 101–111)
CO2: 17 mmol/L — AB (ref 22–32)
CO2: 15 mmol/L — ABNORMAL LOW (ref 22–32)
Calcium: 8.5 mg/dL — ABNORMAL LOW (ref 8.9–10.3)
Chloride: 95 mmol/L — ABNORMAL LOW (ref 101–111)
Creatinine, Ser: 6.6 mg/dL — ABNORMAL HIGH (ref 0.61–1.24)
Creatinine, Ser: 4.74 mg/dL — ABNORMAL HIGH (ref 0.61–1.24)
GFR calc Af Amer: 10 mL/min — ABNORMAL LOW (ref >60)
GFR calc non Af Amer: 8 mL/min — ABNORMAL LOW (ref >60)
GFR, EST AFRICAN AMERICAN: 15 mL/min — AB (ref >60)
GFR, EST NON AFRICAN AMERICAN: 13 mL/min — AB (ref >60)
GLUCOSE: 165 mg/dL — AB (ref 65–99)
Glucose, Bld: 138 mg/dL — ABNORMAL HIGH (ref 65–99)
POTASSIUM: 4.2 mmol/L (ref 3.5–5.1)
Potassium: 6.3 mmol/L (ref 3.5–5.1)
SODIUM: 131 mmol/L — AB (ref 135–145)
Sodium: 125 mmol/L — ABNORMAL LOW (ref 135–145)

## 2016-03-28 LAB — OSMOLALITY, URINE: OSMOLALITY UR: 222 mosm/kg — AB (ref 300–900)

## 2016-03-28 LAB — MRSA PCR SCREENING: MRSA by PCR: NEGATIVE

## 2016-03-28 LAB — PROTEIN / CREATININE RATIO, URINE
Creatinine, Urine: 61.7 mg/dL
Protein Creatinine Ratio: 0.29 mg/mg{Cre} — ABNORMAL HIGH (ref 0.00–0.15)
Total Protein, Urine: 18 mg/dL

## 2016-03-28 LAB — TROPONIN I: Troponin I: <0.03 ng/mL (ref <0.03)

## 2016-03-28 LAB — PHOSPHORUS: PHOSPHORUS: 7.7 mg/dL — AB (ref 2.5–4.6)

## 2016-03-28 LAB — NA AND K (SODIUM & POTASSIUM), RAND UR
Potassium Urine: 6 mmol/L
Sodium, Ur: 38 mmol/L

## 2016-03-28 LAB — OSMOLALITY: OSMOLALITY: 317 mosm/kg — AB (ref 275–295)

## 2016-03-28 LAB — D-DIMER, QUANTITATIVE (NOT AT ARMC): D DIMER QUANT: 0.41 ug{FEU}/mL (ref 0.00–0.50)

## 2016-03-28 LAB — CK: CK TOTAL: 39 U/L — AB (ref 49–397)

## 2016-03-28 MED ORDER — SODIUM POLYSTYRENE SULFONATE 15 GM/60ML PO SUSP
45.0000 g | Freq: Once | ORAL | Status: AC
  Administered 2016-03-28: 45 g via ORAL
  Filled 2016-03-28: qty 180

## 2016-03-28 MED ORDER — INSULIN ASPART 100 UNIT/ML ~~LOC~~ SOLN
10.0000 [IU] | Freq: Once | SUBCUTANEOUS | Status: AC
  Administered 2016-03-28: 10 [IU] via INTRAVENOUS
  Filled 2016-03-28: qty 1

## 2016-03-28 MED ORDER — ENOXAPARIN SODIUM 30 MG/0.3ML ~~LOC~~ SOLN
30.0000 mg | SUBCUTANEOUS | Status: DC
  Administered 2016-03-28 – 2016-03-29 (×2): 30 mg via SUBCUTANEOUS
  Filled 2016-03-28 (×2): qty 0.3

## 2016-03-28 MED ORDER — SODIUM CHLORIDE 0.9 % IV BOLUS (SEPSIS)
1000.0000 mL | Freq: Once | INTRAVENOUS | Status: AC
  Administered 2016-03-28: 1000 mL via INTRAVENOUS

## 2016-03-28 MED ORDER — ALBUTEROL SULFATE (2.5 MG/3ML) 0.083% IN NEBU
INHALATION_SOLUTION | RESPIRATORY_TRACT | Status: AC
  Administered 2016-03-28: 10 mg
  Filled 2016-03-28: qty 12

## 2016-03-28 MED ORDER — SODIUM CHLORIDE 0.9 % IV SOLN
INTRAVENOUS | Status: DC
  Administered 2016-03-28 – 2016-03-30 (×3): via INTRAVENOUS
  Administered 2016-03-30: 1000 mL via INTRAVENOUS
  Administered 2016-03-31: 07:00:00 via INTRAVENOUS

## 2016-03-28 MED ORDER — NEPRO/CARBSTEADY PO LIQD
237.0000 mL | Freq: Two times a day (BID) | ORAL | Status: DC
  Administered 2016-03-29 – 2016-03-31 (×3): 237 mL via ORAL

## 2016-03-28 MED ORDER — DEXTROSE 50 % IV SOLN
INTRAVENOUS | Status: AC
  Administered 2016-03-28: 50 mL via INTRAVENOUS
  Filled 2016-03-28: qty 50

## 2016-03-28 MED ORDER — SODIUM CHLORIDE 0.9% FLUSH
3.0000 mL | Freq: Two times a day (BID) | INTRAVENOUS | Status: DC
  Administered 2016-03-28 – 2016-03-31 (×2): 3 mL via INTRAVENOUS

## 2016-03-28 MED ORDER — ALBUTEROL (5 MG/ML) CONTINUOUS INHALATION SOLN
10.0000 mg/h | INHALATION_SOLUTION | Freq: Once | RESPIRATORY_TRACT | Status: DC
Start: 2016-03-28 — End: 2016-03-31

## 2016-03-28 MED ORDER — NICOTINE 14 MG/24HR TD PT24: 14.0000 mg | MEDICATED_PATCH | Freq: Every day | TRANSDERMAL | Status: DC | PRN

## 2016-03-28 MED ORDER — CYCLOBENZAPRINE HCL 10 MG PO TABS: 10.0000 mg | ORAL_TABLET | Freq: Three times a day (TID) | ORAL | Status: DC | PRN

## 2016-03-28 MED ORDER — DEXTROSE 50 % IV SOLN
50.0000 mL | Freq: Once | INTRAVENOUS | Status: AC
  Administered 2016-03-28: 50 mL via INTRAVENOUS

## 2016-03-28 NOTE — ED Notes (Signed)
CRITICAL VALUE ALERT  Critical value received:  K = 6.3  Date of notification:  03/18/16  Time of notification:  [id number]  Critical value read back:Yes.    Nurse who received alert:  Rosealee Albee  MD notified (1st page):  Mcmanus  Time of first page:  1427  MD notified (2nd page):  Time of second page:  Responding MD:  Mcmanus  Time MD responded:  1428

## 2016-03-28 NOTE — ED Provider Notes (Signed)
[facility] DEPT Provider Note   CSN: [id number] Arrival date & time: 03/28/16  1145     History   Chief Complaint Chief Complaint  Patient presents with  . Back Pain    HPI John Carroll is a 55 y.o. male.   Back Pain     Pt was seen at 1245. Per pt, c/o gradual onset and persistence of constant acute flair of his chronic left sided upper back "pain" for the past 8 months, worse since waking up this morning.  Denies any change in his usual chronic pain pattern.  Pain worsens with palpation of the area and body position changes. States yesterday he was "outside blowing leaves." Denies incont/retention of bowel or bladder, no saddle anesthesia, no focal motor weakness, no tingling/numbness in extremities, no fevers, no direct injury, no CP/SOB, no abd pain.   The symptoms have been associated with no other complaints. Pt states [facility] and "they said it was a muscle spasm." Pt states he is usually rx hydrocodone and flexeril.     Past Medical History:  Diagnosis Date  . Chronic back pain   . Hypertension     There are no active problems to display for this patient.   Past Surgical History:  Procedure Laterality Date  . epidymitis         Home Medications    Prior to Admission medications   Medication Sig Start Date End Date Taking? Authorizing Provider  cyclobenzaprine (FLEXERIL) 10 MG tablet Take 10 mg by mouth 3 (three) times daily as needed for muscle spasms.   Yes Historical Provider, MD  lisinopril-hydrochlorothiazide (PRINZIDE,ZESTORETIC) 20-12.5 MG tablet Take 1 tablet by mouth daily. 02/29/16  Yes Historical Provider, MD  oxyCODONE-acetaminophen (PERCOCET/ROXICET) 5-325 MG tablet 1-2 tablets every 4 (four) hours as needed. 4-6 hours 01/07/16  Yes Historical Provider, MD    Family History   Social History Social History  Substance Use Topics  . Smoking status: Current Every Day Smoker    Packs/day: 0.50    Types: Cigarettes  . Smokeless tobacco: Never Used  . Alcohol use Yes     Comment: occasional     Allergies   Patient has no known allergies.   Review of Systems Review of Systems  Musculoskeletal: Positive for back pain.  ROS: Statement: All systems negative except as marked or noted in the HPI; Constitutional: Negative for fever and chills. ; ; Eyes: Negative for eye pain, redness and discharge. ; ; ENMT: Negative for ear pain, hoarseness, nasal congestion, sinus pressure and sore throat. ; ; Cardiovascular: Negative for chest pain, palpitations, diaphoresis, dyspnea and peripheral edema. ; ; Respiratory: Negative for cough, wheezing and stridor. ; ; Gastrointestinal: Negative for nausea, vomiting, diarrhea, abdominal pain, blood in stool, hematemesis, jaundice and rectal bleeding. . ; ; Genitourinary: Negative for dysuria, flank pain and hematuria. ; ; Musculoskeletal: +back pain. Negative for neck pain. Negative for swelling and trauma.; ; Skin: Negative for pruritus, rash, abrasions, blisters, bruising and skin lesion.; ; Neuro: Negative for headache, lightheadedness and neck stiffness. Negative for weakness, altered level of consciousness, altered mental status, extremity weakness, paresthesias, involuntary movement, seizure and syncope.       Physical Exam Updated Vital Signs BP 97/61 (BP Location: Left Arm)   Pulse 88   Temp (!) 96.3 F (35.7 C) (Temporal)   Resp 18   Ht 5\' 7"  (1.702 m)   Wt 150 lb (68 kg)  SpO2 100%   BMI 23.49 kg/m    Patient Vitals for the past 24 hrs:  BP Temp Temp src Pulse Resp SpO2 Height Weight  03/28/16 1530 102/77 - - (!) 59 16 99 % - -  03/28/16 1515 - - - - (!) 31 - - -  03/28/16 1500 96/66 - - (!) 56 (!) 31 98 % - -  03/28/16 1445 - - - (!) 57 22 96 % - -  03/28/16 1430 90/69 - - (!) 56 17 96 % - -  03/28/16 1415 - - - 64 (!) 30 94 % - -  03/28/16 1400 (!) 88/65 - - 65 25 96 % - -  03/28/16 1345 - - - 78 20 99 % - -    03/28/16 1334 101/71 - - 65 19 97 % - -  03/28/16 1330 101/71 - - 65 14 98 % - -  03/28/16 1328 (!) 89/68 - - - 17 98 % - -  03/28/16 1155 97/61 (!) 96.3 F (35.7 C) Temporal 88 18 100 % 5\' 7"  (1.702 m) 150 lb (68 kg)      Physical Exam 1250: Physical examination:  Nursing notes reviewed; Vital signs and O2 SAT reviewed;  Constitutional: Well developed, Well nourished, In no acute distress; Head:  Normocephalic, atraumatic; Eyes: EOMI, PERRL, No scleral icterus; ENMT: Mouth and pharynx normal, Mucous membranes dry; Neck: Supple, Full range of motion, No lymphadenopathy; Cardiovascular: Regular rate and rhythm, No gallop; Respiratory: Breath sounds clear & equal bilaterally, No wheezes.  Speaking full sentences with ease, Normal respiratory effort/excursion; Chest: Nontender, Movement normal; Abdomen: Soft, Nontender, Nondistended, Normal bowel sounds; Genitourinary: No CVA tenderness; Spine:  No midline CS, TS, LS tenderness. +TTP left upper thoracic paraspinal muscles. No rash.;; Extremities: Pulses normal, No tenderness, No edema, No calf edema or asymmetry.; Neuro: AA&Ox3, Major CN grossly intact.  Speech clear. No gross focal motor or sensory deficits in extremities.; Skin: Color normal, Warm, Dry.   ED Treatments / Results  Labs (all labs ordered are listed, but only abnormal results are displayed)   EKG  EKG Interpretation  Date/Time:  Sunday March 28 2016 13:03:50 EST Ventricular Rate:  76 PR Interval:    QRS Duration: 81 QT Interval:  387 QTC Calculation: 436 R Axis:   75 Text Interpretation:  Sinus rhythm Low voltage, extremity and precordial leads Baseline wander No old tracing to compare Confirmed by [facility]  MD, Nunzio Cory [phone]) on 03/28/2016 1:27:26 PM       Radiology   Procedures Procedures (including critical care time)  Medications Ordered in ED Medications - No data to display   Initial Impression / Assessment and Plan / ED Course  I have reviewed  the triage vital signs and the nursing notes.  Pertinent labs & imaging results that were available during my care of the patient were reviewed by me and considered in my medical decision making (see chart for details).  MDM Reviewed: previous chart, nursing note and vitals Reviewed previous: labs Interpretation: labs, ECG and x-ray Total time providing critical care: 30-74 minutes. This excludes time spent performing separately reportable procedures and services. Consults: admitting MD (and Nephrology)   CRITICAL CARE Performed by: Alfonzo Feller Total critical care time: 35 minutes Critical care time was exclusive of separately billable procedures and treating other patients. Critical care was necessary to treat or prevent imminent or life-threatening deterioration. Critical care was time spent personally by me on the following activities: development of treatment plan with  patient and/or surrogate as well as nursing, discussions with consultants, evaluation of patient's response to treatment, examination of patient, obtaining history from patient or surrogate, ordering and performing treatments and interventions, ordering and review of laboratory studies, ordering and review of radiographic studies, pulse oximetry and re-evaluation of patient's condition.    Results for orders placed or performed during the hospital encounter of 03/28/16  D-dimer, quantitative  Result Value Ref Range   D-Dimer, Quant 0.41 0.00 - 0.50 ug/mL-FEU  Troponin I  Result Value Ref Range   Troponin I <0.03 <0.03 ng/mL  Basic metabolic panel  Result Value Ref Range   Sodium 125 (L) 135 - 145 mmol/L   Potassium 6.3 (HH) 3.5 - 5.1 mmol/L   Chloride 95 (L) 101 - 111 mmol/L   CO2 17 (L) 22 - 32 mmol/L   Glucose, Bld 165 (H) 65 - 99 mg/dL   BUN 108 (H) 6 - 20 mg/dL   Creatinine, Ser 6.60 (H) 0.61 - 1.24 mg/dL   Calcium 9.1 8.9 - 10.3 mg/dL   GFR calc non Af Amer 8 (L) >60 mL/min   GFR calc Af Amer 10  (L) >60 mL/min   Anion gap 13 5 - 15  CBC with Differential  Result Value Ref Range   WBC 10.1 4.0 - 10.5 K/uL   RBC 4.68 4.22 - 5.81 MIL/uL   Hemoglobin 14.8 13.0 - 17.0 g/dL   HCT 42.8 39.0 - 52.0 %   MCV 91.5 78.0 - 100.0 fL   MCH 31.6 26.0 - 34.0 pg   MCHC 34.6 30.0 - 36.0 g/dL   RDW 13.7 11.5 - 15.5 %   Platelets 187 150 - 400 K/uL   Neutrophils Relative % 89 %   Neutro Abs 9.0 (H) 1.7 - 7.7 K/uL   Lymphocytes Relative 7 %   Lymphs Abs 0.7 0.7 - 4.0 K/uL   Monocytes Relative 4 %   Monocytes Absolute 0.4 0.1 - 1.0 K/uL   Eosinophils Relative 0 %   Eosinophils Absolute 0.0 0.0 - 0.7 K/uL   Basophils Relative 0 %   Basophils Absolute 0.0 0.0 - 0.1 K/uL  I-stat Chem 8, ED  Result Value Ref Range   Sodium 125 (L) 135 - 145 mmol/L   Potassium 6.8 (HH) 3.5 - 5.1 mmol/L   Chloride 99 (L) 101 - 111 mmol/L   BUN 121 (H) 6 - 20 mg/dL   Creatinine, Ser 6.30 (H) 0.61 - 1.24 mg/dL   Glucose, Bld 108 (H) 65 - 99 mg/dL   Calcium, Ion 1.08 (L) 1.15 - 1.40 mmol/L   TCO2 19 0 - 100 mmol/L   Hemoglobin 16.0 13.0 - 17.0 g/dL   HCT 47.0 39.0 - 52.0 %   Comment MD NOTIFIED, SUGGEST RECOLLECT    Dg Chest 2 View Result Date: 03/28/2016 CLINICAL DATA:  Upper back pain, weight loss EXAM: CHEST  2 VIEW COMPARISON:  None. FINDINGS: Cardiomediastinal silhouette is unremarkable. No infiltrate or pleural effusion. No pulmonary edema. Bony thorax is unremarkable. IMPRESSION: No active cardiopulmonary disease. Electronically Signed   By: Lahoma Crocker M.D.   On: 03/28/2016 13:38   Dg Thoracic Spine 2 View Result Date: 03/28/2016 CLINICAL DATA:  Upper back pain, no known injury, initial encounter EXAM: THORACIC SPINE 2 VIEWS COMPARISON:  None. FINDINGS: Vertebral body height is well maintained. No pedicle abnormality or paraspinal mass lesion is seen. Very minimal osteophytic changes are noted. The visualized rib cage is within normal limits. IMPRESSION: No acute abnormality noted.  Electronically Signed    By: Inez Catalina M.D.   On: 03/28/2016 12:39     1530:  BMP as above. No hx of same, so labs redrawn.  I-stat chem with similar results. IVF bolus given, as well as IV D50, IV insulin, albuterol neb. Pt denies taking OTC alleve, ibuprofen. Pharm Tech to review pt's home meds.  T/C to [facility] Dr. Lowanda Foster, case discussed, including:  HPI, pertinent PM/SHx, VS/PE, dx testing, ED course and treatment:  Agrees with ED treatment, admit to [facility].   1545: Pharm Tech has verified pt's meds: pt taking HCTZ and lisinopril.   T/C to [facility] Dr. Olevia Bowens, case discussed, including:  HPI, pertinent PM/SHx, VS/PE, dx testing, ED course and treatment:  Agreeable to admit, requests he will come to the ED for evaluation.      Final Clinical Impressions(s) / ED Diagnoses   Final diagnoses:  None    New Prescriptions New Prescriptions   No medications on file      Francine Graven, DO 03/30/16 1644

## 2016-03-28 NOTE — H&P (Signed)
History and Physical    John Carroll [id number] DOB: Jul 05, 1960 DOA: 03/28/2016  PCP: No PCP Per Patient   Patient coming from: Home.  Chief Complaint: Back Pain.  HPI: John Carroll is a 55 y.o. male with medical history significant of chronic back pain, epididymitis, hypertension who is coming to the emergency department due to mid upper back since yesterday radiated to his left arm.  Per patient, he has been having this pain since about 8 months. He has been taking Percocet and Flexeril at home which he states is usually very effective on his pain. However, just today he was blowing leaves in his yard, after about an hour working in his jarred, he developed intense pain in his mid-upper back which did not improve with flexeril and oxycodone like it normally does and persisted through today, so he decided to come to the ER. He denies fever, chills, fatigue, precordial chest pain, palpitations, dyspnea, pitting edema of the lower extremities. He complains of frequent positional dizziness when getting up from bed or standing up. He denies abdominal pain, nausea, emesis, diarrhea, melena, hematochezia, dysuria, hematuria or oliguria.  ED Course: Incidentally, the patient was found to be hyponatremic at 125 and hyperkalemic at 6.3 mmol/L. BUN and creatinine were 108 and 6.6 mg/dL respectively. He was treated with 1 L MS bolus, IV dextrose, IV insulin and albuterol nebulizer treatment.   He is currently taking lisinopril/HCT 20/12.5 mg by mouth daily for hypertension that he gets from the health department. He stated that the last time that his blood work was checked by the [facility], he was told the his kidney function was mildly elevated, but does not remember what were the measurements.   Review of Systems: As per HPI otherwise 10 point review of systems negative.    Past Medical History:  Diagnosis Date  . Chronic back pain   . Epididymitis   . Hypertension     History reviewed. No  pertinent surgical history.   reports that he has been smoking Cigarettes.  He has been smoking about 0.50 packs per day. He has never used smokeless tobacco. He reports that he drinks alcohol. He reports that he does not use drugs.  No Known Allergies  Family History  Problem Relation Age of Onset  . Kidney disease Mother   . Hypertension Mother   . Diabetes Mellitus II Mother   . Heart attack Father 21    Died of MI in 15  . Heart attack Sister   . Hypertension Sister     Prior to Admission medications   Medication Sig Start Date End Date Taking? Authorizing Provider  cyclobenzaprine (FLEXERIL) 10 MG tablet Take 10 mg by mouth 3 (three) times daily as needed for muscle spasms.   Yes Historical Provider, MD  lisinopril-hydrochlorothiazide (PRINZIDE,ZESTORETIC) 20-12.5 MG tablet Take 1 tablet by mouth daily. 02/29/16  Yes Historical Provider, MD  oxyCODONE-acetaminophen (PERCOCET/ROXICET) 5-325 MG tablet 1-2 tablets every 4 (four) hours as needed. 4-6 hours 01/07/16  Yes Historical Provider, MD    Physical Exam:  Constitutional: NAD, calm, comfortable Vitals:   03/28/16 1545 03/28/16 1600 03/28/16 1608 03/28/16 1615  BP:  108/68    Pulse: 65   83  Resp: (!) 31   20  Temp:      TempSrc:      SpO2: 98%  98% 100%  Weight:      Height:       Eyes: PERRL, lids and conjunctivae normal ENMT: Mucous membranes  are moist. Posterior pharynx clear of any exudate or lesions. Absent upper and poor state of repair of remaining lower dentition.  Neck: normal, supple, no masses, no thyromegaly Respiratory: clear to auscultation bilaterally, no wheezing, no crackles. Normal respiratory effort. No accessory muscle use.  Back: Mild mid-lower thoracic paraspinal muscle tenderness on palpation. Cardiovascular: Regular rate and rhythm, no murmurs / rubs / gallops. No extremity edema. 2+ pedal pulses. No carotid bruits.  Abdomen: no tenderness, no masses palpated. No hepatosplenomegaly. Bowel  sounds positive.  Musculoskeletal: no clubbing / cyanosis. No joint deformity upper and lower extremities. Good ROM, no contractures. Normal muscle tone.  Skin: no rashes, lesions, ulcers. No induration Neurologic: CN 2-12 grossly intact. Sensation intact, DTR normal. Strength 5/5 in all 4.  Psychiatric: Normal judgment and insight. Alert and oriented x 4. Normal mood.    Labs on Admission: I have personally reviewed following labs and imaging studies  CBC:  Recent Labs Lab 03/28/16 1328 03/28/16 1500  WBC 10.1  --   NEUTROABS 9.0*  --   HGB 14.8 16.0  HCT 42.8 47.0  MCV 91.5  --   PLT 187  --    Basic Metabolic Panel:  Recent Labs Lab 03/28/16 1328 03/28/16 1500  NA 125* 125*  K 6.3* 6.8*  CL 95* 99*  CO2 17*  --   GLUCOSE 165* 108*  BUN 108* 121*  CREATININE 6.60* 6.30*  CALCIUM 9.1  --   PHOS 7.7*  --    GFR: Estimated Creatinine Clearance: 12.2 mL/min (by C-G formula based on SCr of 6.3 mg/dL (H)). Liver Function Tests: No results for input(s): AST, ALT, ALKPHOS, BILITOT, PROT, ALBUMIN in the last 168 hours. No results for input(s): LIPASE, AMYLASE in the last 168 hours. No results for input(s): AMMONIA in the last 168 hours. Coagulation Profile: No results for input(s): INR, PROTIME in the last 168 hours. Cardiac Enzymes:  Recent Labs Lab 03/28/16 1328  CKTOTAL 39*  TROPONINI <0.03   BNP (last 3 results) No results for input(s): PROBNP in the last 8760 hours. HbA1C: No results for input(s): HGBA1C in the last 72 hours. CBG: No results for input(s): GLUCAP in the last 168 hours. Lipid Profile: No results for input(s): CHOL, HDL, LDLCALC, TRIG, CHOLHDL, LDLDIRECT in the last 72 hours. Thyroid Function Tests: No results for input(s): TSH, T4TOTAL, FREET4, T3FREE, THYROIDAB in the last 72 hours. Anemia Panel: No results for input(s): VITAMINB12, FOLATE, FERRITIN, TIBC, IRON, RETICCTPCT in the last 72 hours. Urine analysis:    Component Value  Date/Time   COLORURINE YELLOW 12/02/2012 0913   APPEARANCEUR CLEAR 12/02/2012 0913   LABSPEC 1.020 12/02/2012 0913   PHURINE 7.0 12/02/2012 0913   GLUCOSEU NEGATIVE 12/02/2012 0913   HGBUR NEGATIVE 12/02/2012 0913   BILIRUBINUR NEGATIVE 12/02/2012 0913   KETONESUR NEGATIVE 12/02/2012 0913   PROTEINUR TRACE (A) 12/02/2012 0913   UROBILINOGEN 0.2 12/02/2012 0913   NITRITE NEGATIVE 12/02/2012 0913   LEUKOCYTESUR NEGATIVE 12/02/2012 0913    Radiological Exams on Admission: Dg Chest 2 View  Result Date: 03/28/2016 CLINICAL DATA:  Upper back pain, weight loss EXAM: CHEST  2 VIEW COMPARISON:  None. FINDINGS: Cardiomediastinal silhouette is unremarkable. No infiltrate or pleural effusion. No pulmonary edema. Bony thorax is unremarkable. IMPRESSION: No active cardiopulmonary disease. Electronically Signed   By: Lahoma Crocker M.D.   On: 03/28/2016 13:38   Dg Thoracic Spine 2 View  Result Date: 03/28/2016 CLINICAL DATA:  Upper back pain, no known injury, initial encounter EXAM:  THORACIC SPINE 2 VIEWS COMPARISON:  None. FINDINGS: Vertebral body height is well maintained. No pedicle abnormality or paraspinal mass lesion is seen. Very minimal osteophytic changes are noted. The visualized rib cage is within normal limits. IMPRESSION: No acute abnormality noted. Electronically Signed   By: Inez Catalina M.D.   On: 03/28/2016 12:39    EKG: Independently reviewed. Vent. rate 76 BPM PR interval * ms QRS duration 81 ms QT/QTc 387/436 ms P-R-T axes 78 75 79 Sinus rhythm Low voltage, extremity and precordial leads Baseline Wander.  Assessment/Plan Principal Problem:   Acute renal failure (ARF) (HCC) Admit to stepdown/inpatient. Hold lisinopril and hydrochlorothiazide. Repeat normal saline 1 L bolus. Check urinalysis, sodium, potassium, creatinine, protein, osmolality. Follow-up BUN, creatinine and electrolytes closely. Nephrology will be consulting on the patient.  Active Problems:    Hyperkalemia The patient received normal saline, albuterol, dextrose and insulin in the ER. Kayexalate 45 g orally 1 was ordered. Hold lisinopril.  Continue cardiac monitoring. Follow-up potassium, BUN and creatinine closely.    Hypertension Hold hydrochlorothiazide and lisinopril. Monitor blood pressure, electrolytes and renal function. Used when necessary oral alpha-adrenergic or IV antihypertensive.    Hyponatremia Hold hydrochlorothiazide. Check serum and urine osmolality. Continue normal saline infusion.    DVT prophylaxis: Lovenox SQ. Code Status: Full code. Family Communication:  Disposition Plan: Admit for hyperkalemia treatment, IV hydration and nephrology evaluation. Consults called: Dr. Lowanda Foster (nephrology) Admission status: Inpatient/stepdown.   Reubin Milan MD [facility] Pager [phone].  If 7PM-7AM, please contact night-coverage [email or web address] Password [facility]  03/28/2016, 4:48 PM

## 2016-03-28 NOTE — ED Notes (Signed)
Pt stable and ready for transport to ICU-06 stepdown.  Report called to Deno Etienne, RN.

## 2016-03-28 NOTE — ED Triage Notes (Addendum)
Patient brought in via EMS from home. Alert and oriented. Airway patent. Patient c/o mid-upper back pain that radiates into left arm. Patient blowing leaves yesterday. Per patient has had this "issue for a while" but usually goes away after an hour of taking pain medication. Per patient no relief in pain this time despite taking hydrocodone and "2 muscle relaxers". Patient states vomiting from pain. Denies any shortness of breath or back pain. Patient states he has been seen at the health department for same pain and given flexeril and pain medication.

## 2016-03-29 ENCOUNTER — Inpatient Hospital Stay (HOSPITAL_COMMUNITY): Payer: Medicaid Other

## 2016-03-29 DIAGNOSIS — N179 Acute kidney failure, unspecified: Secondary | ICD-10-CM

## 2016-03-29 LAB — COMPREHENSIVE METABOLIC PANEL
ALT: 8 U/L — ABNORMAL LOW (ref 17–63)
AST: 15 U/L (ref 15–41)
Albumin: 3.3 g/dL — ABNORMAL LOW (ref 3.5–5.0)
Alkaline Phosphatase: 58 U/L (ref 38–126)
Anion gap: 7 (ref 5–15)
BUN: 92 mg/dL — ABNORMAL HIGH (ref 6–20)
CHLORIDE: 111 mmol/L (ref 101–111)
CO2: 19 mmol/L — ABNORMAL LOW (ref 22–32)
Calcium: 8.5 mg/dL — ABNORMAL LOW (ref 8.9–10.3)
Creatinine, Ser: 3.36 mg/dL — ABNORMAL HIGH (ref 0.61–1.24)
GFR, EST AFRICAN AMERICAN: 22 mL/min — AB (ref >60)
GFR, EST NON AFRICAN AMERICAN: 19 mL/min — AB (ref >60)
Glucose, Bld: 95 mg/dL (ref 65–99)
POTASSIUM: 4.8 mmol/L (ref 3.5–5.1)
Sodium: 137 mmol/L (ref 135–145)
Total Bilirubin: 0.4 mg/dL (ref 0.3–1.2)
Total Protein: 6.1 g/dL — ABNORMAL LOW (ref 6.5–8.1)

## 2016-03-29 LAB — CBC WITH DIFFERENTIAL/PLATELET
BASOS ABS: 0 10*3/uL (ref 0.0–0.1)
Basophils Relative: 0 %
EOS PCT: 0 %
Eosinophils Absolute: 0 10*3/uL (ref 0.0–0.7)
HCT: 36.9 % — ABNORMAL LOW (ref 39.0–52.0)
Hemoglobin: 12.4 g/dL — ABNORMAL LOW (ref 13.0–17.0)
LYMPHS PCT: 27 %
Lymphs Abs: 1.7 10*3/uL (ref 0.7–4.0)
MCH: 30.6 pg (ref 26.0–34.0)
MCHC: 33.6 g/dL (ref 30.0–36.0)
MCV: 91.1 fL (ref 78.0–100.0)
MONO ABS: 0.6 10*3/uL (ref 0.1–1.0)
Monocytes Relative: 9 %
Neutro Abs: 4.2 10*3/uL (ref 1.7–7.7)
Neutrophils Relative %: 64 %
PLATELETS: 177 10*3/uL (ref 150–400)
RBC: 4.05 MIL/uL — ABNORMAL LOW (ref 4.22–5.81)
RDW: 13.8 % (ref 11.5–15.5)
WBC: 6.5 10*3/uL (ref 4.0–10.5)

## 2016-03-29 LAB — URINE CULTURE: Culture: NO GROWTH

## 2016-03-29 MED ORDER — TRAZODONE HCL 50 MG PO TABS
50.0000 mg | ORAL_TABLET | Freq: Every evening | ORAL | Status: DC | PRN
  Administered 2016-03-30: 50 mg via ORAL
  Filled 2016-03-29: qty 1

## 2016-03-29 NOTE — Consult Note (Signed)
Reason for Consult: Acute kidney injury Referring Physician: Dr. Blanca Friend John Carroll is an 55 y.o. male.  HPI: He is a patient who has chronic pain, hypertension presently came with complaints of severe back pain for the last couple of months. According to patient he has back pain for about 8-9 months and was taking pain medication and Flexeril. Yesterday he had similar pain after working but continued to get worse without any improvement even though he is taking his pain medication. Patient also was feeling dizzy and lightheaded since decided to come to the hospital. When he was evaluated and was found to have hyperkalemia and acute kidney injury he is admitted to the hospital. Patient denies any previous history of renal failure or kidney stone.  Past Medical History:  Diagnosis Date  . Chronic back pain   . Epididymitis   . Hypertension     History reviewed. No pertinent surgical history.  Family History  Problem Relation Age of Onset  . Kidney disease Mother   . Hypertension Mother   . Diabetes Mellitus II Mother   . Heart attack Father 21    Died of MI in 28  . Heart attack Sister   . Hypertension Sister     Social History:  reports that he has been smoking Cigarettes.  He has been smoking about 0.50 packs per day. He has never used smokeless tobacco. He reports that he drinks alcohol. He reports that he does not use drugs.  Allergies: No Known Allergies  Medications: I have reviewed the patient's current medications.  Results for orders placed or performed during the hospital encounter of 03/28/16 (from the past 48 hour(s))  D-dimer, quantitative     Status: None   Collection Time: 03/28/16  1:28 PM  Result Value Ref Range   D-Dimer, Quant 0.41 0.00 - 0.50 ug/mL-FEU    Comment: (NOTE) At the manufacturer cut-off of 0.50 ug/mL FEU, this assay has been documented to exclude PE with a sensitivity and negative predictive value of 97 to 99%.  At this time, this assay  has not been approved by the FDA to exclude DVT/VTE. Results should be correlated with clinical presentation.   Troponin I     Status: None   Collection Time: 03/28/16  1:28 PM  Result Value Ref Range   Troponin I <0.03 <0.03 ng/mL  Basic metabolic panel     Status: Abnormal   Collection Time: 03/28/16  1:28 PM  Result Value Ref Range   Sodium 125 (L) 135 - 145 mmol/L   Potassium 6.3 (HH) 3.5 - 5.1 mmol/L    Comment: CRITICAL RESULT CALLED TO, READ BACK BY AND VERIFIED WITH: VAUGHAN J. AT 1424 ON [id number] BY THOMPSON S.    Chloride 95 (L) 101 - 111 mmol/L   CO2 17 (L) 22 - 32 mmol/L   Glucose, Bld 165 (H) 65 - 99 mg/dL   BUN 108 (H) 6 - 20 mg/dL    Comment: RESULTS CONFIRMED BY MANUAL DILUTION   Creatinine, Ser 6.60 (H) 0.61 - 1.24 mg/dL   Calcium 9.1 8.9 - 10.3 mg/dL   GFR calc non Af Amer 8 (L) >60 mL/min   GFR calc Af Amer 10 (L) >60 mL/min    Comment: (NOTE) The eGFR has been calculated using the CKD EPI equation. This calculation has not been validated in all clinical situations. eGFR's persistently <60 mL/min signify possible Chronic Kidney Disease.    Anion gap 13 5 - 15  CBC with  Differential     Status: Abnormal   Collection Time: 03/28/16  1:28 PM  Result Value Ref Range   WBC 10.1 4.0 - 10.5 K/uL   RBC 4.68 4.22 - 5.81 MIL/uL   Hemoglobin 14.8 13.0 - 17.0 g/dL   HCT 42.8 39.0 - 52.0 %   MCV 91.5 78.0 - 100.0 fL   MCH 31.6 26.0 - 34.0 pg   MCHC 34.6 30.0 - 36.0 g/dL   RDW 13.7 11.5 - 15.5 %   Platelets 187 150 - 400 K/uL   Neutrophils Relative % 89 %   Neutro Abs 9.0 (H) 1.7 - 7.7 K/uL   Lymphocytes Relative 7 %   Lymphs Abs 0.7 0.7 - 4.0 K/uL   Monocytes Relative 4 %   Monocytes Absolute 0.4 0.1 - 1.0 K/uL   Eosinophils Relative 0 %   Eosinophils Absolute 0.0 0.0 - 0.7 K/uL   Basophils Relative 0 %   Basophils Absolute 0.0 0.0 - 0.1 K/uL  Phosphorus     Status: Abnormal   Collection Time: 03/28/16  1:28 PM  Result Value Ref Range   Phosphorus 7.7 (H)  2.5 - 4.6 mg/dL  CK     Status: Abnormal   Collection Time: 03/28/16  1:28 PM  Result Value Ref Range   Total CK 39 (L) 49 - 397 U/L  Osmolality     Status: Abnormal   Collection Time: 03/28/16  1:28 PM  Result Value Ref Range   Osmolality 317 (H) 275 - 295 mOsm/kg    Comment: Performed at [facility]  I-stat Chem 8, ED     Status: Abnormal   Collection Time: 03/28/16  3:00 PM  Result Value Ref Range   Sodium 125 (L) 135 - 145 mmol/L   Potassium 6.8 (HH) 3.5 - 5.1 mmol/L   Chloride 99 (L) 101 - 111 mmol/L   BUN 121 (H) 6 - 20 mg/dL   Creatinine, Ser 6.30 (H) 0.61 - 1.24 mg/dL   Glucose, Bld 108 (H) 65 - 99 mg/dL   Calcium, Ion 1.08 (L) 1.15 - 1.40 mmol/L   TCO2 19 0 - 100 mmol/L   Hemoglobin 16.0 13.0 - 17.0 g/dL   HCT 47.0 39.0 - 52.0 %   Comment MD NOTIFIED, SUGGEST RECOLLECT   Urinalysis, Routine w reflex microscopic     Status: Abnormal   Collection Time: 03/28/16  3:06 PM  Result Value Ref Range   Color, Urine YELLOW YELLOW   APPearance CLEAR CLEAR   Specific Gravity, Urine 1.015 1.005 - 1.030   pH 5.5 5.0 - 8.0   Glucose, UA NEGATIVE NEGATIVE mg/dL   Hgb urine dipstick TRACE (A) NEGATIVE   Bilirubin Urine NEGATIVE NEGATIVE   Ketones, ur NEGATIVE NEGATIVE mg/dL   Protein, ur TRACE (A) NEGATIVE mg/dL   Nitrite NEGATIVE NEGATIVE   Leukocytes, UA NEGATIVE NEGATIVE  Urine microscopic-add on     Status: Abnormal   Collection Time: 03/28/16  3:06 PM  Result Value Ref Range   Squamous Epithelial / LPF 0-5 (A) NONE SEEN   WBC, UA 0-5 0 - 5 WBC/hpf   RBC / HPF 0-5 0 - 5 RBC/hpf   Bacteria, UA FEW (A) NONE SEEN  MRSA PCR Screening     Status: None   Collection Time: 03/28/16  5:02 PM  Result Value Ref Range   MRSA by PCR NEGATIVE NEGATIVE    Comment:        The GeneXpert MRSA Assay (FDA approved for NASAL  specimens only), is one component of a comprehensive MRSA colonization surveillance program. It is not intended to diagnose MRSA infection nor to guide  or monitor treatment for MRSA infections.   Na and K (sodium & potassium), rand urine     Status: None   Collection Time: 03/28/16  6:07 PM  Result Value Ref Range   Sodium, Ur 38 mmol/L   Potassium Urine 6 mmol/L  Protein / creatinine ratio, urine     Status: Abnormal   Collection Time: 03/28/16  6:07 PM  Result Value Ref Range   Creatinine, Urine 61.70 mg/dL   Total Protein, Urine 18 mg/dL    Comment: NO NORMAL RANGE ESTABLISHED FOR THIS TEST   Protein Creatinine Ratio 0.29 (H) 0.00 - 0.15 mg/mg[Cre]  Creatinine, urine, random     Status: None   Collection Time: 03/28/16  6:07 PM  Result Value Ref Range   Creatinine, Urine 63.05 mg/dL  Osmolality, urine     Status: Abnormal   Collection Time: 03/28/16  6:20 PM  Result Value Ref Range   Osmolality, Ur 222 (L) 300 - 900 mOsm/kg    Comment: Performed at [place] metabolic panel     Status: Abnormal   Collection Time: 03/28/16  8:22 PM  Result Value Ref Range   Sodium 131 (L) 135 - 145 mmol/L   Potassium 4.2 3.5 - 5.1 mmol/L    Comment: DELTA CHECK NOTED   Chloride 106 101 - 111 mmol/L   CO2 15 (L) 22 - 32 mmol/L   Glucose, Bld 138 (H) 65 - 99 mg/dL   BUN 102 (H) 6 - 20 mg/dL   Creatinine, Ser 4.74 (H) 0.61 - 1.24 mg/dL   Calcium 8.5 (L) 8.9 - 10.3 mg/dL   GFR calc non Af Amer 13 (L) >60 mL/min   GFR calc Af Amer 15 (L) >60 mL/min    Comment: (NOTE) The eGFR has been calculated using the CKD EPI equation. This calculation has not been validated in all clinical situations. eGFR's persistently <60 mL/min signify possible Chronic Kidney Disease.    Anion gap 10 5 - 15  CBC WITH DIFFERENTIAL     Status: Abnormal   Collection Time: 03/29/16  4:11 AM  Result Value Ref Range   WBC 6.5 4.0 - 10.5 K/uL   RBC 4.05 (L) 4.22 - 5.81 MIL/uL   Hemoglobin 12.4 (L) 13.0 - 17.0 g/dL    Comment: DELTA CHECK NOTED   HCT 36.9 (L) 39.0 - 52.0 %   MCV 91.1 78.0 - 100.0 fL   MCH 30.6 26.0 - 34.0 pg   MCHC 33.6 30.0 -  36.0 g/dL   RDW 13.8 11.5 - 15.5 %   Platelets 177 150 - 400 K/uL   Neutrophils Relative % 64 %   Neutro Abs 4.2 1.7 - 7.7 K/uL   Lymphocytes Relative 27 %   Lymphs Abs 1.7 0.7 - 4.0 K/uL   Monocytes Relative 9 %   Monocytes Absolute 0.6 0.1 - 1.0 K/uL   Eosinophils Relative 0 %   Eosinophils Absolute 0.0 0.0 - 0.7 K/uL   Basophils Relative 0 %   Basophils Absolute 0.0 0.0 - 0.1 K/uL  Comprehensive metabolic panel     Status: Abnormal   Collection Time: 03/29/16  4:11 AM  Result Value Ref Range   Sodium 137 135 - 145 mmol/L   Potassium 4.8 3.5 - 5.1 mmol/L   Chloride 111 101 - 111 mmol/L   CO2 19 (  L) 22 - 32 mmol/L   Glucose, Bld 95 65 - 99 mg/dL   BUN 92 (H) 6 - 20 mg/dL   Creatinine, Ser 3.36 (H) 0.61 - 1.24 mg/dL   Calcium 8.5 (L) 8.9 - 10.3 mg/dL   Total Protein 6.1 (L) 6.5 - 8.1 g/dL   Albumin 3.3 (L) 3.5 - 5.0 g/dL   AST 15 15 - 41 U/L   ALT 8 (L) 17 - 63 U/L   Alkaline Phosphatase 58 38 - 126 U/L   Total Bilirubin 0.4 0.3 - 1.2 mg/dL   GFR calc non Af Amer 19 (L) >60 mL/min   GFR calc Af Amer 22 (L) >60 mL/min    Comment: (NOTE) The eGFR has been calculated using the CKD EPI equation. This calculation has not been validated in all clinical situations. eGFR's persistently <60 mL/min signify possible Chronic Kidney Disease.    Anion gap 7 5 - 15    Dg Chest 2 View  Result Date: 03/28/2016 CLINICAL DATA:  Upper back pain, weight loss EXAM: CHEST  2 VIEW COMPARISON:  None. FINDINGS: Cardiomediastinal silhouette is unremarkable. No infiltrate or pleural effusion. No pulmonary edema. Bony thorax is unremarkable. IMPRESSION: No active cardiopulmonary disease. Electronically Signed   By: Lahoma Crocker M.D.   On: 03/28/2016 13:38   Dg Thoracic Spine 2 View  Result Date: 03/28/2016 CLINICAL DATA:  Upper back pain, no known injury, initial encounter EXAM: THORACIC SPINE 2 VIEWS COMPARISON:  None. FINDINGS: Vertebral body height is well maintained. No pedicle abnormality or  paraspinal mass lesion is seen. Very minimal osteophytic changes are noted. The visualized rib cage is within normal limits. IMPRESSION: No acute abnormality noted. Electronically Signed   By: Inez Catalina M.D.   On: 03/28/2016 12:39    Review of Systems  Constitutional: Negative for fever.  Respiratory: Negative for shortness of breath.   Cardiovascular: Negative for chest pain and leg swelling.  Gastrointestinal: Negative for nausea and vomiting.  Musculoskeletal: Positive for back pain.  Neurological: Positive for weakness.   Blood pressure (!) 91/52, pulse 100, temperature 98.1 F (36.7 C), temperature source Oral, resp. rate (!) 9, height _0  (1.702 m), weight 63.1 kg (139 lb 1.8 oz), SpO2 98 %. Physical Exam  Constitutional: He is oriented to person, place, and time. No distress.  Eyes: No scleral icterus.  Neck: No JVD present.  Cardiovascular: Normal rate and regular rhythm.   Respiratory: No respiratory distress. He has no wheezes.  GI: He exhibits no distension. There is no tenderness.  Musculoskeletal: He exhibits no edema.  Neurological: He is alert and oriented to person, place, and time.    Assessment/Plan: Problem #1 acute kidney injury: Possibly accommodation of prerenal syndrome/ACE/ATN. Presently patient is nonoliguric and his renal function is improving. Patient is asymptomatic. Problem #2 hypertension: A shunt was on combination of lisinopril/HCTZ. His blood pressure is somewhat low. Medications has been discontinued. Presently he has hypotension. Problem #3 chronic back pain: Problem #4 hyperkalemia: Possibly a combination of worsening renal failure/high potassium intake/lisinopril. His potassium has corrected. Problem #5 low CO2: Possibly metabolic . Presently his CO2 is improving. Problem #6 hyponatremia: Hypovolemic hyponatremia. His sodium has improved Plan: We'll increase his IV fluid to 1 40 mL per hour We'll do ultrasound of the kidneys We'll check his  renal panel in the morning.   Jamille Yoshino S 03/29/2016, 7:52 AM

## 2016-03-29 NOTE — Progress Notes (Addendum)
John Carroll Kitchen  PROGRESS NOTE  John Carroll [id number] DOB: 15-Jul-1960 DOA: 03/28/2016 PCP: No PCP Per Patient  Brief Narrative: 55 year old man presented with acute on chronic upper back pain ongoing for 8 months, usually treated with Flexeril and Percocet. In the emergency department basic blood work revealed a surprising finding of acute kidney injury, hyperkalemia. Admitted for further evaluation.  Assessment/Plan: 1. Acute kidney injury with associated hyperkalemia and metabolic acidosis. Etiology unclear. On lisinopril and hydrochlorothiazide as an outpatient. Denies NSAIDs. Nonoliguric. Renal function improving. 2. Dehydration (hyponatremia, hypochloremia) 3. Hyponatremia. Resolved. Secondary to acute kidney injury. 4. Hyperkalemia. Resolved. Secondary to AKI. 5. Normocytic anemia. Hemoglobin is 12.4. There are no signs of bleeding at this time. Will continue to monitor.  6. HTN. Pressures are running low (secondary to dehydration). Lisinopril and hydrochlorothiazide have been held.     Continue Intravenous fluids. Repeat BMP in AM.  Follow-up renal ultrasound.  D/c telemetry  DVT prophylaxis: Lovenox  Code Status:Full  Family Communication:   Disposition Plan: Discharge home once improved.   Murray Hodgkins, MD  [facility] Direct contact: [phone] --Via amion app OR  --[email or web address]; password [email or web address]  7PM-7AM contact night coverage as above 03/29/2016, 9:56 AM  LOS: 1 day   Consultants:  None   Procedures:  None   Antimicrobials:  None   CC:  Follow-up renal failure.   Interval history/Subjective: Doing well, eating well. No overnight problems or breathing problems. Mid-back pain remains.   ROS: No nausea or vomiting.   Objective: Vitals:   03/29/16 0000 03/29/16 0400 03/29/16 0500 03/29/16 0831  BP:      Pulse:      Resp:      Temp: 98 F (36.7 C) 98.1 F (36.7 C)  98.1 F (36.7 C)  TempSrc: Oral Oral  Oral  SpO2:      Weight:   63.1 kg  (139 lb 1.8 oz)   Height:        Intake/Output Summary (Last 24 hours) at 03/29/16 0956 Last data filed at 03/29/16 0900  Gross per 24 hour  Intake             1000 ml  Output              950 ml  Net               50 ml     Filed Weights   03/28/16 1155 03/28/16 1713 03/29/16 0500  Weight: 68 kg (150 lb) 62.8 kg (138 lb 7.2 oz) 63.1 kg (139 lb 1.8 oz)    Exam:  Constitutional:  . Appears calm and comfortable Eyes:  . PERRL and irises appear normal . Conjunctivae and lids appear normal ENMT:  . external ears, nose appear normal . grossly normal hearing  . Lips appear normal; Tongue normal.  Respiratory:  . CTA bilaterally, no w/r/r.  . Respiratory effort normal. No retractions or accessory muscle use Cardiovascular:  . RRR, no m/r/g . No LE extremity edema   . Telemetry sinus rhythm  Abdomen:   soft, non tender, and non distended.  Psychiatric:  . Grossly normal mood  . Judgement and insight appear normal  I have personally reviewed following labs and imaging studies:  Creatinine on admission 6.6. Down to 3.36 today. BUN trending down, 92. CO2 stable, 19. Anion gap normal. LFTs unremarkable.  Sodium has normalized, 137. Potassium is normalized, 4.8. Albumin only modestly low, 3.3. LFTs otherwise unremarkable.  WBC BC unremarkable. Hemoglobin 12.4.  Scheduled Meds: . albuterol  10 mg/hr Nebulization Once  . enoxaparin (LOVENOX) injection  30 mg Subcutaneous Q24H  . feeding supplement (NEPRO CARB STEADY)  237 mL Oral BID BM  . sodium chloride flush  3 mL Intravenous Q12H   Continuous Infusions: . sodium chloride 140 mL/hr at 03/29/16 0804    Principal Problem:   AKI (acute kidney injury) ([place]) Active Problems:   Hypertension   Hyponatremia   LOS: 1 day   Time spent 25 minutes      By signing my name below, I, Collene Leyden, attest that this documentation has been prepared under the direction and in the presence of Murray Hodgkins,  MD. Electronically signed: Collene Leyden, Scribe. 03/29/16 8:35 AM   I personally performed the services described in this documentation. All medical record entries made by the scribe were at my direction. I have reviewed the chart and agree that the record reflects my personal performance and is accurate and complete. Murray Hodgkins, MD

## 2016-03-29 NOTE — Progress Notes (Signed)
Initial Nutrition Assessment  INTERVENTION:  Downgrade diet to puree textures/ renal restrictions  Nepro Shake po BID, each supplement provides 425 kcal and 19 grams protein    NUTRITION DIAGNOSIS:   Biting/chewing difficulty related to inability to eat due to many missing teeth as evidenced by per patient/family report (has not been able to have his dentures made yet)   GOAL:   Patient will meet greater than or equal to 90% of their needs   MONITOR:   PO intake, Weight trends, Labs  REASON FOR ASSESSMENT:   Malnutrition Screening Tool    ASSESSMENT:   Patient presents with back pain and was found to have acute kidney injury. He was dehydrated on admission.   He says about 2 months ago he had his teeth pulled in preporation to have dentures fitted. His has had difficulty eating due to limited chewing ability. He says his weight was 168# (76.3 kg) when his teeth were pulled.   Currently his wt recorded at 139# (63 kg) which show a severe unplanned loss. His diet has been downgraded to purred textures to facilitate po's with renal restrictions. He is able to feed himself.  His recent meal intake recorded at 90% today which is very good. RD will continue to follow.  Unable to complete Nutrition-Focused physical exam at this time.       Recent Labs Lab 03/28/16 1328 03/28/16 1500 03/28/16 2022 03/29/16 0411  NA 125* 125* 131* 137  K 6.3* 6.8* 4.2 4.8  CL 95* 99* 106 111  CO2 17*  --  15* 19*  BUN 108* 121* 102* 92*  CREATININE 6.60* 6.30* 4.74* 3.36*  CALCIUM 9.1  --  8.5* 8.5*  PHOS 7.7*  --   --   --   GLUCOSE 165* 108* 138* 95   Labs: BUN 92, Cr 3.36, Phos 7.7  Meds: reviewed  Diet Order:  Diet renal with fluid restriction Fluid restriction: 1200 mL Fluid; Room service appropriate? Yes; Fluid consistency: Thin  Skin:  Reviewed, no issues  Last BM:  11/26  Height:   Ht Readings from Last 1 Encounters:  03/28/16 5\' 7"  (1.702 m)    Weight:   Wt  Readings from Last 1 Encounters:  03/29/16 139 lb 1.8 oz (63.1 kg)    Ideal Body Weight:  67 kg  BMI:  Body mass index is 21.79 kg/m.  Estimated Nutritional Needs:   Kcal:  2205-2394 (to prevent further weight loss)  Protein:  75-88 gr  Fluid:  1.9 liter daily (normal needs)   EDUCATION NEEDS:  None identified at this time    Colman Cater MS,RD,CSG,LDN Office: [phone] Pager: [phone]

## 2016-03-29 NOTE — Progress Notes (Signed)
Called for patient requesting sleep medication.  Chart reviewed.  Ordered Trazodone 50 mg qhs prn sleep.    Carlyon Shadow, M.D.

## 2016-03-30 LAB — BASIC METABOLIC PANEL
ANION GAP: 4 — AB (ref 5–15)
BUN: 52 mg/dL — ABNORMAL HIGH (ref 6–20)
CALCIUM: 8.4 mg/dL — AB (ref 8.9–10.3)
CO2: 21 mmol/L — ABNORMAL LOW (ref 22–32)
Chloride: 116 mmol/L — ABNORMAL HIGH (ref 101–111)
Creatinine, Ser: 1.51 mg/dL — ABNORMAL HIGH (ref 0.61–1.24)
GFR calc Af Amer: 58 mL/min — ABNORMAL LOW (ref >60)
GFR, EST NON AFRICAN AMERICAN: 50 mL/min — AB (ref >60)
Glucose, Bld: 80 mg/dL (ref 65–99)
POTASSIUM: 4.7 mmol/L (ref 3.5–5.1)
SODIUM: 141 mmol/L (ref 135–145)

## 2016-03-30 MED ORDER — ENOXAPARIN SODIUM 40 MG/0.4ML ~~LOC~~ SOLN
40.0000 mg | SUBCUTANEOUS | Status: DC
  Administered 2016-03-30: 40 mg via SUBCUTANEOUS
  Filled 2016-03-30: qty 0.4

## 2016-03-30 NOTE — Progress Notes (Signed)
Transferred to 303 in stable condition, reported to M. Bullins, Therapist, sports.

## 2016-03-30 NOTE — Progress Notes (Signed)
PROGRESS NOTE  John Carroll [id number] DOB: 1960/07/13 DOA: 03/28/2016 PCP: No PCP Per Patient  Brief Narrative: 55 year old man presented with acute on chronic upper back pain ongoing for 8 months, usually treated with Flexeril and Percocet. In the emergency department basic blood work revealed a surprising finding of acute kidney injury, hyperkalemia. Admitted for further evaluation.  Assessment/Plan: 1. Acute kidney injury with associated hyperkalemia and metabolic acidosis. Etiology unclear. Suspect secondary to poor oral intake. Patient reports history in the past of poor oral intake resulting in kidney problems. On lisinopril and hydrochlorothiazide as an outpatient. Denies NSAIDs. Adequate urine output. Renal function nearly normal now. Renal ultrasound was unremarkable. 2. Dehydration (hyponatremia, hypochloremia), resolved with IV fluids. 3. Hyponatremia. Resolved. Secondary to acute kidney injury. 4. Hyperkalemia. Resolved. Secondary to AKI. 5. Normocytic anemia. Hemoglobin is 12.4, decreased secondary to volume. Stable. Follow-up as an outpatient. 6. HTN. Low blood pressures improved with fluids.   Dramatically improved. Continue intravenous fluids.   Management per nephrology including IV fluids.    Repeat basic metabolic panel in the morning  Anticipate discharge home next 24 hours if continues to improve.  DVT prophylaxis: Lovenox  Code Status: Full  Family Communication: No family bedside Disposition Plan: Discharge home in 24 hours.   Murray Hodgkins, MD  [facility] Direct contact: [phone] --Via amion app OR  --[email or web address]; password [email or web address]  7PM-7AM contact night coverage as above 03/30/2016, 7:28 AM  LOS: 2 days   Consultants:  Nephrology   Procedures:  None   Antimicrobials:  None   CC: Follow-up renal failure   Interval history/Subjective: Doing well. Breathing and eating well.   ROS: No nausea or vomiting.    Objective: Vitals:   03/29/16 2000 03/30/16 0000 03/30/16 0400 03/30/16 0500  BP: 115/74 (!) 100/55 99/67   Pulse: 64 61 72   Resp: 18 20 18    Temp: 98.4 F (36.9 C) 97.9 F (36.6 C) 97.8 F (36.6 C)   TempSrc: Oral Oral Oral   SpO2: 100% 99% 99%   Weight:    63.1 kg (139 lb 1.8 oz)  Height:        Intake/Output Summary (Last 24 hours) at 03/30/16 0728 Last data filed at 03/30/16 0500  Gross per 24 hour  Intake             2664 ml  Output             1225 ml  Net             1439 ml     Filed Weights   03/28/16 1713 03/29/16 0500 03/30/16 0500  Weight: 62.8 kg (138 lb 7.2 oz) 63.1 kg (139 lb 1.8 oz) 63.1 kg (139 lb 1.8 oz)    Exam:    Constitutional:  . Appears calm and comfortable Respiratory:  . CTA bilaterally, no w/r/r.  . Respiratory effort normal. Cardiovascular:  . RRR, no m/r/g . No LE extremity edema    I have personally reviewed following labs and imaging studies:  Renal US was unremarkable.   Urine output 1225+ unrecorded void  Potassium normal, CO2 improved 21, BUN improved 52, creatinine near normal 1.5 on  Scheduled Meds: . albuterol  10 mg/hr Nebulization Once  . enoxaparin (LOVENOX) injection  30 mg Subcutaneous Q24H  . feeding supplement (NEPRO CARB STEADY)  237 mL Oral BID BM  . sodium chloride flush  3 mL Intravenous Q12H   Continuous Infusions: . sodium chloride 140 mL/hr at 03/30/16  0000    Principal Problem:   AKI (acute kidney injury) ([place]) Active Problems:   Hypertension   Hyponatremia   LOS: 2 days   Time spent 15 minutes      By signing my name below, I, Collene Leyden, attest that this documentation has been prepared under the direction and in the presence of Murray Hodgkins, MD. Electronically signed: Collene Leyden, Scribe. 03/30/16 9:04 AM   I personally performed the services described in this documentation. All medical record entries made by the scribe were at my direction. I have reviewed the chart and  agree that the record reflects my personal performance and is accurate and complete. Murray Hodgkins, MD

## 2016-03-30 NOTE — Progress Notes (Signed)
Subjective: Interval History: has no complaint of nausea or vomiting. He denies any difficulty breathing. Overall is getting better..  Objective: Vital signs in last 24 hours: Temp:  [97.8 F (36.6 C)-98.4 F (36.9 C)] 97.8 F (36.6 C) (11/28 0400) Pulse Rate:  [61-79] 72 (11/28 0400) Resp:  [18-20] 18 (11/28 0400) BP: (99-116)/(55-77) 99/67 (11/28 0400) SpO2:  [99 %-100 %] 99 % (11/28 0400) Weight:  [63.1 kg (139 lb 1.8 oz)] 63.1 kg (139 lb 1.8 oz) (11/28 0500) Weight change: -4.94 kg (-10 lb 14.2 oz)  Intake/Output from previous day: 11/27 0701 - 11/28 0700 In: 2664 [P.O.:720; I.V.:1944] Out: 1225 [Urine:1225] Intake/Output this shift: No intake/output data recorded.  General appearance: alert, cooperative and no distress Resp: clear to auscultation bilaterally Cardio: regular rate and rhythm, S1, S2 normal, no murmur, click, rub or gallop Extremities: extremities normal, atraumatic, no cyanosis or edema  Lab Results:  Recent Labs  03/28/16 1328 03/28/16 1500 03/29/16 0411  WBC 10.1  --  6.5  HGB 14.8 16.0 12.4*  HCT 42.8 47.0 36.9*  PLT 187  --  177   BMET:  Recent Labs  03/29/16 0411 03/30/16 0413  NA 137 141  K 4.8 4.7  CL 111 116*  CO2 19* 21*  GLUCOSE 95 80  BUN 92* 52*  CREATININE 3.36* 1.51*  CALCIUM 8.5* 8.4*   No results for input(s): PTH in the last 72 hours. Iron Studies: No results for input(s): IRON, TIBC, TRANSFERRIN, FERRITIN in the last 72 hours.  Studies/Results: Dg Chest 2 View  Result Date: 03/28/2016 CLINICAL DATA:  Upper back pain, weight loss EXAM: CHEST  2 VIEW COMPARISON:  None. FINDINGS: Cardiomediastinal silhouette is unremarkable. No infiltrate or pleural effusion. No pulmonary edema. Bony thorax is unremarkable. IMPRESSION: No active cardiopulmonary disease. Electronically Signed   By: Lahoma Crocker M.D.   On: 03/28/2016 13:38   Dg Thoracic Spine 2 View  Result Date: 03/28/2016 CLINICAL DATA:  Upper back pain, no known  injury, initial encounter EXAM: THORACIC SPINE 2 VIEWS COMPARISON:  None. FINDINGS: Vertebral body height is well maintained. No pedicle abnormality or paraspinal mass lesion is seen. Very minimal osteophytic changes are noted. The visualized rib cage is within normal limits. IMPRESSION: No acute abnormality noted. Electronically Signed   By: Inez Catalina M.D.   On: 03/28/2016 12:39   US Renal  Result Date: 03/29/2016 CLINICAL DATA:  Acute renal failure superimposed on chronic renal failure EXAM: RENAL / URINARY TRACT ULTRASOUND COMPLETE COMPARISON:  None. FINDINGS: Right Kidney: Length: 12.5 cm. Echogenicity and renal cortical thickness are within normal limits. No mass, perinephric fluid, or hydronephrosis visualized. No sonographically demonstrable calculus or ureterectasis. Left Kidney: Length: 10.6 cm. Echogenicity and renal cortical thickness are within normal limits. No mass, perinephric fluid, or hydronephrosis visualized. No sonographically demonstrable calculus or ureterectasis. Bladder: Appears normal for degree of bladder distention. IMPRESSION: Study within normal limits. Electronically Signed   By: Lowella Grip III M.D.   On: 03/29/2016 09:51    I have reviewed the patient's current medications.  Assessment/Plan: Problem #1 acute kidney injury: Presently his renal function is improving. Patient is nonoliguric and asymptomatic. Problem #2 hyperkalemia: His potassium has corrected Problem #3 hyponatremia: Thought to be secondary to hypovolemic hyponatremia and his sodium is normal. Problem #4 hypertension: His blood pressure is low normal. Patient came with hypotension Problem#5 history of low CO2: Possibly metabolic. Presently improving.  Problem #6 chronic back pain Plan: Decrease IV fluid to 75 mL per hour  We'll check his renal panel in the morning.   LOS: 2 days   Tavin Vernet S 03/30/2016,8:00 AM

## 2016-03-31 DIAGNOSIS — I10 Essential (primary) hypertension: Secondary | ICD-10-CM

## 2016-03-31 DIAGNOSIS — N179 Acute kidney failure, unspecified: Principal | ICD-10-CM

## 2016-03-31 DIAGNOSIS — E875 Hyperkalemia: Secondary | ICD-10-CM

## 2016-03-31 DIAGNOSIS — E871 Hypo-osmolality and hyponatremia: Secondary | ICD-10-CM

## 2016-03-31 LAB — RENAL FUNCTION PANEL
Albumin: 2.8 g/dL — ABNORMAL LOW (ref 3.5–5.0)
Anion gap: 5 (ref 5–15)
BUN: 29 mg/dL — AB (ref 6–20)
CHLORIDE: 113 mmol/L — AB (ref 101–111)
CO2: 20 mmol/L — AB (ref 22–32)
Calcium: 8 mg/dL — ABNORMAL LOW (ref 8.9–10.3)
Creatinine, Ser: 1.24 mg/dL (ref 0.61–1.24)
GFR calc Af Amer: >60 mL/min (ref >60)
GFR calc non Af Amer: >60 mL/min (ref >60)
GLUCOSE: 113 mg/dL — AB (ref 65–99)
POTASSIUM: 4 mmol/L (ref 3.5–5.1)
Phosphorus: 1.6 mg/dL — ABNORMAL LOW (ref 2.5–4.6)
Sodium: 138 mmol/L (ref 135–145)

## 2016-03-31 NOTE — Care Management Note (Signed)
Case Management Note  Patient Details  Name: John Carroll MRN: [id number] Date of Birth: [birth year]  Subjective/Objective:                  Pt is from home, lives alone and is ind with ADL's. He has medicaid with drug coverage. He has no PCP. He has been to the [facility] in the past but it interested in finding new PCP. Pt given list of PCP's in area that will accept medicaid. Pt understands he must contact MD office of choice and fill out paper work prior to appointment being made. Pt verbalizes understanding and states he will f/u at DC.   Action/Plan: Pt discharging home today with self care.   Expected Discharge Date:     03/31/2016             Expected Discharge Plan:  Home/Self Care  In-House Referral:  NA  Discharge planning Services  CM Consult  Post Acute Care Choice:  NA Choice offered to:  NA  Status of Service:   Completed  Sherald Barge, RN 03/31/2016, 2:00 PM

## 2016-03-31 NOTE — Discharge Summary (Signed)
Physician Discharge Summary  John Carroll [id number] DOB: 1960-07-23 DOA: 03/28/2016  PCP: No PCP Per Patient  Admit date: 03/28/2016 Discharge date: 03/31/2016  Admitted From: Home  Disposition: Home   Recommendations for Outpatient Follow-up:  1. Follow up with PCP in 1-2 weeks 2. Please obtain BMP/CBC in one week 3. Lisinopril/HCTZ currently on hold. Consider resuming if renal function is stable on follow up  Home Health: No Equipment/Devices: None   Discharge Condition: Stable  CODE STATUS: FULL  Diet recommendation: Heart healthy   Brief/Interim Summary: 33 year oldman presented with acute on chronic upper back pain ongoing for 8 months, usually treated with Flexeril and Percocet. While in the ED, he was found to have an acute kidney injury, hyponatremic, and hyperkalemia. Treated with IV fluids, IV insulin, and dextrose. Admitted for further evaluation.  Discharge Diagnoses:  Principal Problem:   AKI (acute kidney injury) ([place]) Active Problems:   Hypertension   Hyponatremia  1. Acute kidney injury with associated hyperkalemia and metabolic acidosis. Due to poor oral intake. He has been unable to eat/drink consistently, due to poor dentition. He is in the process of getting dentures made. Patient reported history in the past of poor oral intake resulting in kidney problems. Denied NSAIDs. Renal ultrasound was unremarkable. Adequate urine output. He was treated with IV fluids with the improvement of his renal function. ACE/HCTZ held on admission. Recommend rechecking renal function in 1 week and if stable, then consider resuming ACE/HCTZ 2. Dehydration (hyponatremia, hypochloremia), resolved with IV fluids. 3. Hyponatremia. Resolved. Secondary to dehydration. Resolved with IV fluids 4. Hyperkalemia.Resolved. Secondary to AKI. 5. HTN. Blood pressures currently stable. ACE/HCTZ held on admission due to renal failure. Consider resuming lisinopril and hydrochlorothiazide  as an outpatient if renal function remains stable.  Discharge Instructions  Discharge Instructions    Diet - low sodium heart healthy    Complete by:  As directed    Increase activity slowly    Complete by:  As directed        Medication List    STOP taking these medications   lisinopril-hydrochlorothiazide 20-12.5 MG tablet Commonly known as:  PRINZIDE,ZESTORETIC     TAKE these medications   cyclobenzaprine 10 MG tablet Commonly known as:  FLEXERIL Take 10 mg by mouth 3 (three) times daily as needed for muscle spasms.   oxyCODONE-acetaminophen 5-325 MG tablet Commonly known as:  PERCOCET/ROXICET 1-2 tablets every 4 (four) hours as needed. 4-6 hours       No Known Allergies  Consultations:  Nephrology   Procedures/Studies: Dg Chest 2 View  Result Date: 03/28/2016 CLINICAL DATA:  Upper back pain, weight loss EXAM: CHEST  2 VIEW COMPARISON:  None. FINDINGS: Cardiomediastinal silhouette is unremarkable. No infiltrate or pleural effusion. No pulmonary edema. Bony thorax is unremarkable. IMPRESSION: No active cardiopulmonary disease. Electronically Signed   By: Lahoma Crocker M.D.   On: 03/28/2016 13:38   Dg Thoracic Spine 2 View  Result Date: 03/28/2016 CLINICAL DATA:  Upper back pain, no known injury, initial encounter EXAM: THORACIC SPINE 2 VIEWS COMPARISON:  None. FINDINGS: Vertebral body height is well maintained. No pedicle abnormality or paraspinal mass lesion is seen. Very minimal osteophytic changes are noted. The visualized rib cage is within normal limits. IMPRESSION: No acute abnormality noted. Electronically Signed   By: Inez Catalina M.D.   On: 03/28/2016 12:39   US Renal  Result Date: 03/29/2016 CLINICAL DATA:  Acute renal failure superimposed on chronic renal failure EXAM: RENAL / URINARY TRACT  ULTRASOUND COMPLETE COMPARISON:  None. FINDINGS: Right Kidney: Length: 12.5 cm. Echogenicity and renal cortical thickness are within normal limits. No mass, perinephric  fluid, or hydronephrosis visualized. No sonographically demonstrable calculus or ureterectasis. Left Kidney: Length: 10.6 cm. Echogenicity and renal cortical thickness are within normal limits. No mass, perinephric fluid, or hydronephrosis visualized. No sonographically demonstrable calculus or ureterectasis. Bladder: Appears normal for degree of bladder distention. IMPRESSION: Study within normal limits. Electronically Signed   By: Lowella Grip III M.D.   On: 03/29/2016 09:51      Subjective: No dizziness or lightheadedness. No chest pain or shortness of breath.  Discharge Exam: Vitals:   03/30/16 2039 03/31/16 0517  BP: 118/68 122/89  Pulse: 70 70  Resp: 18 18  Temp: 98.7 F (37.1 C) 98.5 F (36.9 C)   Vitals:   03/30/16 0822 03/30/16 1239 03/30/16 2039 03/31/16 0517  BP:  117/76 118/68 122/89  Pulse: 63 71 70 70  Resp: 20 20 18 18   Temp: 98.1 F (36.7 C)  98.7 F (37.1 C) 98.5 F (36.9 C)  TempSrc: Oral  Oral Oral  SpO2: 100% 100% 100% 99%  Weight:    65 kg (143 lb 6.4 oz)  Height:        General: Pt is alert, awake, not in acute distress Cardiovascular: RRR, S1/S2 +, no rubs, no gallops Respiratory: CTA bilaterally, no wheezing, no rhonchi Abdominal: Soft, NT, ND, bowel sounds + Extremities: no edema, no cyanosis    The results of significant diagnostics from this hospitalization (including imaging, microbiology, ancillary and laboratory) are listed below for reference.     Microbiology: Recent Results (from the past 240 hour(s))  Urine culture     Status: None   Collection Time: 03/28/16  3:06 PM  Result Value Ref Range Status   Specimen Description URINE, CLEAN CATCH  Final   Special Requests NONE  Final   Culture NO GROWTH Performed at [facility]   Final   Report Status 03/29/2016 FINAL  Final  MRSA PCR Screening     Status: None   Collection Time: 03/28/16  5:02 PM  Result Value Ref Range Status   MRSA by PCR NEGATIVE NEGATIVE Final     Comment:        The GeneXpert MRSA Assay (FDA approved for NASAL specimens only), is one component of a comprehensive MRSA colonization surveillance program. It is not intended to diagnose MRSA infection nor to guide or monitor treatment for MRSA infections.      Labs: BNP (last 3 results) No results for input(s): BNP in the last 8760 hours. Basic Metabolic Panel:  Recent Labs Lab 03/28/16 1328 03/28/16 1500 03/28/16 2022 03/29/16 0411 03/30/16 0413 03/31/16 0429  NA 125* 125* 131* 137 141 138  K 6.3* 6.8* 4.2 4.8 4.7 4.0  CL 95* 99* 106 111 116* 113*  CO2 17*  --  15* 19* 21* 20*  GLUCOSE 165* 108* 138* 95 80 113*  BUN 108* 121* 102* 92* 52* 29*  CREATININE 6.60* 6.30* 4.74* 3.36* 1.51* 1.24  CALCIUM 9.1  --  8.5* 8.5* 8.4* 8.0*  PHOS 7.7*  --   --   --   --  1.6*   Liver Function Tests:  Recent Labs Lab 03/29/16 0411 03/31/16 0429  AST 15  --   ALT 8*  --   ALKPHOS 58  --   BILITOT 0.4  --   PROT 6.1*  --   ALBUMIN 3.3* 2.8*   No  results for input(s): LIPASE, AMYLASE in the last 168 hours. No results for input(s): AMMONIA in the last 168 hours. CBC:  Recent Labs Lab 03/28/16 1328 03/28/16 1500 03/29/16 0411  WBC 10.1  --  6.5  NEUTROABS 9.0*  --  4.2  HGB 14.8 16.0 12.4*  HCT 42.8 47.0 36.9*  MCV 91.5  --  91.1  PLT 187  --  177   Cardiac Enzymes:  Recent Labs Lab 03/28/16 1328  CKTOTAL 39*  TROPONINI <0.03   BNP: Invalid input(s): POCBNP CBG: No results for input(s): GLUCAP in the last 168 hours. D-Dimer  Recent Labs  03/28/16 1328  DDIMER 0.41   Hgb A1c No results for input(s): HGBA1C in the last 72 hours. Lipid Profile No results for input(s): CHOL, HDL, LDLCALC, TRIG, CHOLHDL, LDLDIRECT in the last 72 hours. Thyroid function studies No results for input(s): TSH, T4TOTAL, T3FREE, THYROIDAB in the last 72 hours.  Invalid input(s): FREET3 Anemia work up No results for input(s): VITAMINB12, FOLATE, FERRITIN, TIBC, IRON,  RETICCTPCT in the last 72 hours. Urinalysis    Component Value Date/Time   COLORURINE YELLOW 03/28/2016 1506   APPEARANCEUR CLEAR 03/28/2016 1506   LABSPEC 1.015 03/28/2016 1506   PHURINE 5.5 03/28/2016 1506   GLUCOSEU NEGATIVE 03/28/2016 1506   HGBUR TRACE (A) 03/28/2016 1506   BILIRUBINUR NEGATIVE 03/28/2016 1506   KETONESUR NEGATIVE 03/28/2016 1506   PROTEINUR TRACE (A) 03/28/2016 1506   UROBILINOGEN 0.2 12/02/2012 0913   NITRITE NEGATIVE 03/28/2016 1506   LEUKOCYTESUR NEGATIVE 03/28/2016 1506   Sepsis Labs Invalid input(s): PROCALCITONIN,  WBC,  LACTICIDVEN Microbiology Recent Results (from the past 240 hour(s))  Urine culture     Status: None   Collection Time: 03/28/16  3:06 PM  Result Value Ref Range Status   Specimen Description URINE, CLEAN CATCH  Final   Special Requests NONE  Final   Culture NO GROWTH Performed at [facility]   Final   Report Status 03/29/2016 FINAL  Final  MRSA PCR Screening     Status: None   Collection Time: 03/28/16  5:02 PM  Result Value Ref Range Status   MRSA by PCR NEGATIVE NEGATIVE Final    Comment:        The GeneXpert MRSA Assay (FDA approved for NASAL specimens only), is one component of a comprehensive MRSA colonization surveillance program. It is not intended to diagnose MRSA infection nor to guide or monitor treatment for MRSA infections.      Time coordinating discharge: Over 30 minutes  SIGNED:   Kathie Dike, MD  [facility] 03/31/2016, 1:24 PM If 7PM-7AM, please contact night-coverage [email or web address] Password [email or web address]

## 2016-03-31 NOTE — Progress Notes (Signed)
John Carroll  MRN: [id number]  DOB/AGE: 57-Dec-1962 57 y.o.  Primary Care Physician:No PCP Per Patient  Admit date: 03/28/2016  Chief Complaint:  Chief Complaint  Patient presents with  . Back Pain    S-Pt presented on  03/28/2016 with  Chief Complaint  Patient presents with  . Back Pain  .    Pt today feels better.    Pt main concern is " Will I be going home today?"  Meds . albuterol  10 mg/hr Nebulization Once  . enoxaparin (LOVENOX) injection  40 mg Subcutaneous Q24H  . feeding supplement (NEPRO CARB STEADY)  237 mL Oral BID BM  . sodium chloride flush  3 mL Intravenous Q12H      Physical Exam: Vital signs in last 24 hours: Temp:  [98.5 F (36.9 C)-98.7 F (37.1 C)] 98.5 F (36.9 C) (11/29 0517) Pulse Rate:  [70-71] 70 (11/29 0517) Resp:  [18-20] 18 (11/29 0517) BP: (117-122)/(68-89) 122/89 (11/29 0517) SpO2:  [99 %-100 %] 99 % (11/29 0517) Weight:  [143 lb 6.4 oz (65 kg)] 143 lb 6.4 oz (65 kg) (11/29 0517) Weight change: 4 lb 4.6 oz (1.946 kg) Last BM Date: 03/28/16  Intake/Output from previous day: 11/28 0701 - 11/29 0700 In: 1439.4 [P.O.:600; I.V.:839.4] Out: 300 [Urine:300] No intake/output data recorded.   Physical Exam: General- pt is awake,alert, oriented to time place and person Resp- No acute REsp distress, CTA B/L NO Rhonchi CVS- S1S2 regular in rate and rhythm GIT- BS+, soft, NT, ND EXT- NO LE Edema, Cyanosis   Lab Results: CBC  Recent Labs  03/28/16 1328 03/28/16 1500 03/29/16 0411  WBC 10.1  --  6.5  HGB 14.8 16.0 12.4*  HCT 42.8 47.0 36.9*  PLT 187  --  177    BMET  Recent Labs  03/30/16 0413 03/31/16 0429  NA 141 138  K 4.7 4.0  CL 116* 113*  CO2 21* 20*  GLUCOSE 80 113*  BUN 52* 29*  CREATININE 1.51* 1.24  CALCIUM 8.4* 8.0*   Creat trend 2017  6.6==>1.5=>1.24  MICRO Recent Results (from the past 240 hour(s))  Urine culture     Status: None   Collection Time: 03/28/16  3:06 PM  Result Value Ref Range  Status   Specimen Description URINE, CLEAN CATCH  Final   Special Requests NONE  Final   Culture NO GROWTH Performed at [facility]   Final   Report Status 03/29/2016 FINAL  Final  MRSA PCR Screening     Status: None   Collection Time: 03/28/16  5:02 PM  Result Value Ref Range Status   MRSA by PCR NEGATIVE NEGATIVE Final    Comment:        The GeneXpert MRSA Assay (FDA approved for NASAL specimens only), is one component of a comprehensive MRSA colonization surveillance program. It is not intended to diagnose MRSA infection nor to guide or monitor treatment for MRSA infections.       Lab Results  Component Value Date   CALCIUM 8.0 (L) 03/31/2016   CAION 1.08 (L) 03/28/2016   PHOS 1.6 (L) 03/31/2016               Impression: 1)Renal  AKI secondary to Prerenal/ATN/ACE                AKI now improving                Creat trending down  2)HTN BP better Pt was hypotensive at the time of admission  3)Anemia HGb at goal (9--11)   4)Hyperphosphatemia-Most likley sec to AKI Now better  5)Hyperkalemia   Now better   6)Hyponatremia Now better    7)Acid base Co2 just below the goal     Plan:  Will continue current tx      BHUTANI,MANPREET S 03/31/2016, 9:13 AM

## 2016-04-12 ENCOUNTER — Telehealth: Payer: Self-pay

## 2016-04-12 NOTE — Telephone Encounter (Signed)
Left message to call back to set up new pt appt with dr Meda Coffee

## 2016-08-31 DIAGNOSIS — K859 Acute pancreatitis without necrosis or infection, unspecified: Secondary | ICD-10-CM

## 2016-08-31 HISTORY — DX: Acute pancreatitis without necrosis or infection, unspecified: K85.90

## 2016-09-23 ENCOUNTER — Emergency Department (HOSPITAL_COMMUNITY): Payer: Medicaid Other

## 2016-09-23 ENCOUNTER — Encounter (HOSPITAL_COMMUNITY): Payer: Self-pay | Admitting: Emergency Medicine

## 2016-09-23 ENCOUNTER — Inpatient Hospital Stay (HOSPITAL_COMMUNITY)
Admission: EM | Admit: 2016-09-23 | Discharge: 2016-09-26 | DRG: 439 | Disposition: A | Discharge status: Home / Self Care | Payer: Medicaid Other | Attending: Family Medicine | Admitting: Family Medicine

## 2016-09-23 DIAGNOSIS — K279 Peptic ulcer, site unspecified, unspecified as acute or chronic, without hemorrhage or perforation: Secondary | ICD-10-CM | POA: Diagnosis present

## 2016-09-23 DIAGNOSIS — E860 Dehydration: Secondary | ICD-10-CM | POA: Diagnosis present

## 2016-09-23 DIAGNOSIS — K2980 Duodenitis without bleeding: Secondary | ICD-10-CM | POA: Diagnosis present

## 2016-09-23 DIAGNOSIS — Z720 Tobacco use: Secondary | ICD-10-CM | POA: Diagnosis not present

## 2016-09-23 DIAGNOSIS — R748 Abnormal levels of other serum enzymes: Secondary | ICD-10-CM | POA: Diagnosis present

## 2016-09-23 DIAGNOSIS — F17210 Nicotine dependence, cigarettes, uncomplicated: Secondary | ICD-10-CM | POA: Diagnosis present

## 2016-09-23 DIAGNOSIS — I10 Essential (primary) hypertension: Secondary | ICD-10-CM | POA: Diagnosis not present

## 2016-09-23 DIAGNOSIS — E872 Acidosis: Secondary | ICD-10-CM | POA: Diagnosis present

## 2016-09-23 DIAGNOSIS — N179 Acute kidney failure, unspecified: Secondary | ICD-10-CM | POA: Diagnosis not present

## 2016-09-23 DIAGNOSIS — F1210 Cannabis abuse, uncomplicated: Secondary | ICD-10-CM | POA: Diagnosis present

## 2016-09-23 DIAGNOSIS — M549 Dorsalgia, unspecified: Secondary | ICD-10-CM | POA: Diagnosis present

## 2016-09-23 DIAGNOSIS — R739 Hyperglycemia, unspecified: Secondary | ICD-10-CM | POA: Diagnosis present

## 2016-09-23 DIAGNOSIS — E871 Hypo-osmolality and hyponatremia: Secondary | ICD-10-CM | POA: Diagnosis present

## 2016-09-23 DIAGNOSIS — E875 Hyperkalemia: Secondary | ICD-10-CM | POA: Diagnosis present

## 2016-09-23 DIAGNOSIS — R109 Unspecified abdominal pain: Secondary | ICD-10-CM | POA: Diagnosis not present

## 2016-09-23 DIAGNOSIS — K8500 Idiopathic acute pancreatitis without necrosis or infection: Secondary | ICD-10-CM

## 2016-09-23 DIAGNOSIS — G8929 Other chronic pain: Secondary | ICD-10-CM | POA: Diagnosis present

## 2016-09-23 DIAGNOSIS — E162 Hypoglycemia, unspecified: Secondary | ICD-10-CM | POA: Diagnosis not present

## 2016-09-23 DIAGNOSIS — Z79899 Other long term (current) drug therapy: Secondary | ICD-10-CM | POA: Diagnosis not present

## 2016-09-23 DIAGNOSIS — K859 Acute pancreatitis without necrosis or infection, unspecified: Secondary | ICD-10-CM | POA: Diagnosis not present

## 2016-09-23 DIAGNOSIS — E46 Unspecified protein-calorie malnutrition: Secondary | ICD-10-CM | POA: Diagnosis present

## 2016-09-23 LAB — URINALYSIS, ROUTINE W REFLEX MICROSCOPIC
BILIRUBIN URINE: NEGATIVE
Glucose, UA: NEGATIVE mg/dL
Hgb urine dipstick: NEGATIVE
Ketones, ur: 5 mg/dL — AB
LEUKOCYTES UA: NEGATIVE
Nitrite: NEGATIVE
PH: 5 (ref 5.0–8.0)
Protein, ur: 30 mg/dL — AB
SPECIFIC GRAVITY, URINE: 1.012 (ref 1.005–1.030)

## 2016-09-23 LAB — COMPREHENSIVE METABOLIC PANEL
ALT: 10 U/L — AB (ref 17–63)
AST: 15 U/L (ref 15–41)
Albumin: 4 g/dL (ref 3.5–5.0)
Alkaline Phosphatase: 73 U/L (ref 38–126)
Anion gap: 12 (ref 5–15)
BILIRUBIN TOTAL: 0.7 mg/dL (ref 0.3–1.2)
BUN: 132 mg/dL — AB (ref 6–20)
CHLORIDE: 102 mmol/L (ref 101–111)
CO2: 18 mmol/L — ABNORMAL LOW (ref 22–32)
CREATININE: 5.57 mg/dL — AB (ref 0.61–1.24)
Calcium: 8.9 mg/dL (ref 8.9–10.3)
GFR calc Af Amer: 12 mL/min — ABNORMAL LOW (ref >60)
GFR, EST NON AFRICAN AMERICAN: 10 mL/min — AB (ref >60)
Glucose, Bld: 150 mg/dL — ABNORMAL HIGH (ref 65–99)
Potassium: 5.3 mmol/L — ABNORMAL HIGH (ref 3.5–5.1)
Sodium: 132 mmol/L — ABNORMAL LOW (ref 135–145)
TOTAL PROTEIN: 7.2 g/dL (ref 6.5–8.1)

## 2016-09-23 LAB — RAPID URINE DRUG SCREEN, HOSP PERFORMED
AMPHETAMINES: NOT DETECTED
Barbiturates: NOT DETECTED
Benzodiazepines: POSITIVE — AB
Cocaine: NOT DETECTED
OPIATES: POSITIVE — AB
Tetrahydrocannabinol: POSITIVE — AB

## 2016-09-23 LAB — GLUCOSE, CAPILLARY: GLUCOSE-CAPILLARY: 82 mg/dL (ref 65–99)

## 2016-09-23 LAB — CBC
HCT: 37.5 % — ABNORMAL LOW (ref 39.0–52.0)
Hemoglobin: 12.9 g/dL — ABNORMAL LOW (ref 13.0–17.0)
MCH: 31.8 pg (ref 26.0–34.0)
MCHC: 34.4 g/dL (ref 30.0–36.0)
MCV: 92.4 fL (ref 78.0–100.0)
Platelets: 208 10*3/uL (ref 150–400)
RBC: 4.06 MIL/uL — ABNORMAL LOW (ref 4.22–5.81)
RDW: 12.8 % (ref 11.5–15.5)
WBC: 10.4 10*3/uL (ref 4.0–10.5)

## 2016-09-23 LAB — TRIGLYCERIDES: TRIGLYCERIDES: 108 mg/dL (ref <150)

## 2016-09-23 LAB — ETHANOL

## 2016-09-23 LAB — LIPASE, BLOOD: Lipase: 2197 U/L — ABNORMAL HIGH (ref 11–51)

## 2016-09-23 MED ORDER — PIPERACILLIN-TAZOBACTAM 3.375 G IVPB 30 MIN
3.3750 g | Freq: Once | INTRAVENOUS | Status: AC
  Administered 2016-09-23: 3.375 g via INTRAVENOUS
  Filled 2016-09-23 (×2): qty 50

## 2016-09-23 MED ORDER — ONDANSETRON HCL 4 MG/2ML IJ SOLN
4.0000 mg | Freq: Once | INTRAMUSCULAR | Status: AC
  Administered 2016-09-23: 4 mg via INTRAVENOUS
  Filled 2016-09-23: qty 2

## 2016-09-23 MED ORDER — INSULIN ASPART 100 UNIT/ML ~~LOC~~ SOLN: 0.0000 [IU] | Freq: Every day | SUBCUTANEOUS | Status: DC

## 2016-09-23 MED ORDER — SODIUM CHLORIDE 0.9 % IV BOLUS (SEPSIS)
1000.0000 mL | Freq: Once | INTRAVENOUS | Status: AC
  Administered 2016-09-23: 1000 mL via INTRAVENOUS

## 2016-09-23 MED ORDER — IOPAMIDOL (ISOVUE-300) INJECTION 61%
INTRAVENOUS | Status: AC
  Administered 2016-09-23: 15:00:00
  Filled 2016-09-23: qty 30

## 2016-09-23 MED ORDER — INSULIN ASPART 100 UNIT/ML ~~LOC~~ SOLN: 0.0000 [IU] | Freq: Three times a day (TID) | SUBCUTANEOUS | Status: DC

## 2016-09-23 MED ORDER — ACETAMINOPHEN 325 MG PO TABS: 650.0000 mg | ORAL_TABLET | Freq: Four times a day (QID) | ORAL | Status: DC | PRN

## 2016-09-23 MED ORDER — ONDANSETRON HCL 4 MG/2ML IJ SOLN
4.0000 mg | Freq: Four times a day (QID) | INTRAMUSCULAR | Status: DC | PRN
  Administered 2016-09-23: 4 mg via INTRAVENOUS
  Filled 2016-09-23: qty 2

## 2016-09-23 MED ORDER — PIPERACILLIN-TAZOBACTAM 3.375 G IVPB
INTRAVENOUS | Status: AC
  Filled 2016-09-23: qty 50

## 2016-09-23 MED ORDER — HYDRALAZINE HCL 20 MG/ML IJ SOLN: 5.0000 mg | INTRAMUSCULAR | Status: DC | PRN

## 2016-09-23 MED ORDER — PIPERACILLIN SOD-TAZOBACTAM SO 2.25 (2-0.25) G IV SOLR
2.2500 g | Freq: Three times a day (TID) | INTRAVENOUS | Status: DC
  Administered 2016-09-24 – 2016-09-25 (×4): 2.25 g via INTRAVENOUS
  Filled 2016-09-23 (×11): qty 2.25

## 2016-09-23 MED ORDER — LACTATED RINGERS IV SOLN
INTRAVENOUS | Status: DC
  Administered 2016-09-23 – 2016-09-26 (×6): via INTRAVENOUS

## 2016-09-23 MED ORDER — MORPHINE SULFATE (PF) 2 MG/ML IV SOLN
2.0000 mg | INTRAVENOUS | Status: DC | PRN
  Administered 2016-09-23 – 2016-09-24 (×4): 2 mg via INTRAVENOUS
  Filled 2016-09-23 (×4): qty 1

## 2016-09-23 MED ORDER — HYDROMORPHONE HCL 1 MG/ML IJ SOLN
1.0000 mg | Freq: Once | INTRAMUSCULAR | Status: AC
  Administered 2016-09-23: 1 mg via INTRAVENOUS
  Filled 2016-09-23: qty 1

## 2016-09-23 MED ORDER — ACETAMINOPHEN 650 MG RE SUPP: 650.0000 mg | Freq: Four times a day (QID) | RECTAL | Status: DC | PRN

## 2016-09-23 MED ORDER — ONDANSETRON HCL 4 MG PO TABS: 4.0000 mg | ORAL_TABLET | Freq: Four times a day (QID) | ORAL | Status: DC | PRN

## 2016-09-23 MED ORDER — NICOTINE 14 MG/24HR TD PT24
14.0000 mg | MEDICATED_PATCH | Freq: Every day | TRANSDERMAL | Status: DC
  Administered 2016-09-23 – 2016-09-25 (×3): 14 mg via TRANSDERMAL
  Filled 2016-09-23 (×3): qty 1

## 2016-09-23 NOTE — Progress Notes (Signed)
Pharmacy Antibiotic Note  John Carroll is a 56 y.o. male admitted on 09/23/2016 with intra-abdominal infection.  Pharmacy has been consulted for zosyn dosing.  Plan: Zosyn 3.375gm IV x1, then 2.25gm IV q8h F/U cxs, clinical progress, V/S, and labs  Height: 5\' 7"  (170.2 cm) Weight: 135 lb 11.2 oz (61.6 kg) IBW/kg (Calculated) : 66.1  Temp (24hrs), Avg:97.6 F (36.4 C), Min:97.6 F (36.4 C), Max:97.6 F (36.4 C)   Recent Labs Lab 09/23/16 1222  WBC 10.4  CREATININE 5.57*    Estimated Creatinine Clearance: 12.7 mL/min (A) (by C-G formula based on SCr of 5.57 mg/dL (H)).    No Known Allergies  Antimicrobials this admission: Zosyn 5/24 >>  Dose adjustments this admission: N/A  Thank you for allowing pharmacy to be a part of this patient's care.  Isac Sarna, BS Pharm D, [place] Clinical Pharmacist Pager [phone]  09/23/2016 7:59 PM

## 2016-09-23 NOTE — ED Provider Notes (Signed)
[place] DEPT Provider Note   CSN: [id number] Arrival date & time: 09/23/16  1216   By signing my name below, I, Neta Mends, attest that this documentation has been prepared under the direction and in the presence of Nat Christen, MD . Electronically Signed: Neta Mends, ED Scribe. 09/23/2016. 1:14 PM.   History   Chief Complaint Chief Complaint  Patient presents with  . Dehydration  . Abdominal Pain    The history is provided by the patient. No language interpreter was used.   HPI Comments:  John Carroll is a 56 y.o. male who presents to the Emergency Department complaining of constant, worsening lower abdominal pain x 2 days. He states that the pain is primarily in his mid and lower abdomen. Pt reports poor oral intake for the past 2 weeks, in part due to several recent tooth extractions. Pt complains of associated diarrhea. Denies any abdominal surgeries. No alleviating factors noted. Denies vomiting.   Past Medical History:  Diagnosis Date  . Chronic back pain   . Epididymitis   . Hypertension     Patient Active Problem List   Diagnosis Date Noted  . Pancreatitis 09/23/2016  . Marijuana abuse 09/23/2016  . Tobacco abuse 09/23/2016  . Hyperglycemia 09/23/2016  . AKI (acute kidney injury) ([place]) 03/29/2016  . Acute renal failure (ARF) ([place]) 03/28/2016  . Hypertension 03/28/2016  . Hyponatremia 03/28/2016    History reviewed. No pertinent surgical history.     Home Medications    Prior to Admission medications   Medication Sig Start Date End Date Taking? Authorizing Provider  Multiple Vitamin (MULTIVITAMIN WITH MINERALS) TABS tablet Take 1 tablet by mouth daily.   Yes [provider]    Family History Family History  Problem Relation Age of Onset  . Kidney disease Mother   . Hypertension Mother   . Diabetes Mellitus II Mother   . Heart attack Father 79       Died of MI in 39  . Heart attack Sister   . Hypertension Sister      Social History Social History  Substance Use Topics  . Smoking status: Current Every Day Smoker    Packs/day: 0.50    Years: 27.00    Types: Cigarettes  . Smokeless tobacco: Never Used  . Alcohol use No     Comment: quit after rehab     Allergies   Patient has no known allergies.   Review of Systems Review of Systems All systems reviewed and are negative for acute change except as noted in the HPI.   Physical Exam Updated Vital Signs BP (!) 142/87 (BP Location: Left Arm)   Pulse (!) 101   Temp 98.1 F (36.7 C) (Oral)   Resp 18   Ht 5\' 7"  (1.702 m)   Wt 61.6 kg (135 lb 11.2 oz)   SpO2 99%   BMI 21.25 kg/m   Physical Exam  Constitutional: He is oriented to person, place, and time. He appears well-developed and well-nourished.  Generally in pain, pale and slightly dehydrated.   HENT:  Head: Normocephalic and atraumatic.  Eyes: Conjunctivae are normal.  Neck: Neck supple.  Cardiovascular: Normal rate and regular rhythm.   Pulmonary/Chest: Effort normal and breath sounds normal.  Abdominal: Soft. Bowel sounds are normal.  Moderately tender in entire lower abdomen.   Musculoskeletal: Normal range of motion.  Neurological: He is alert and oriented to person, place, and time.  Skin: Skin is warm and dry.  Psychiatric:  He has a normal mood and affect. His behavior is normal.  Nursing note and vitals reviewed.    ED Treatments / Results  DIAGNOSTIC STUDIES:  Oxygen Saturation is 100% on RA, normal by my interpretation.    COORDINATION OF CARE:  1:10 PM Will order CT of abdomen. Discussed treatment plan with pt at bedside and pt agreed to plan.   Labs (all labs ordered are listed, but only abnormal results are displayed) Labs Reviewed  LIPASE, BLOOD - Abnormal; Notable for the following:       Result Value   Lipase 2,197 (*)    All other components within normal limits  COMPREHENSIVE METABOLIC PANEL - Abnormal; Notable for the following:    Sodium  132 (*)    Potassium 5.3 (*)    CO2 18 (*)    Glucose, Bld 150 (*)    BUN 132 (*)    Creatinine, Ser 5.57 (*)    ALT 10 (*)    GFR calc non Af Amer 10 (*)    GFR calc Af Amer 12 (*)    All other components within normal limits  CBC - Abnormal; Notable for the following:    RBC 4.06 (*)    Hemoglobin 12.9 (*)    HCT 37.5 (*)    All other components within normal limits  URINALYSIS, ROUTINE W REFLEX MICROSCOPIC - Abnormal; Notable for the following:    APPearance HAZY (*)    Ketones, ur 5 (*)    Protein, ur 30 (*)    Bacteria, UA RARE (*)    Squamous Epithelial / LPF 0-5 (*)    All other components within normal limits  TRIGLYCERIDES  HIV ANTIBODY (ROUTINE TESTING)  COMPREHENSIVE METABOLIC PANEL  CBC  HEMOGLOBIN A1C  ETHANOL  RAPID URINE DRUG SCREEN, HOSP PERFORMED    EKG  EKG Interpretation None       Radiology Ct Abdomen Pelvis Wo Contrast  Result Date: 09/23/2016 CLINICAL DATA:  Abdominal pain EXAM: CT ABDOMEN AND PELVIS WITHOUT CONTRAST TECHNIQUE: Multidetector CT imaging of the abdomen and pelvis was performed following the standard protocol without IV contrast. COMPARISON:  None. FINDINGS: Lower chest: No pulmonary nodules. No visible pleural or pericardial effusion. Hepatobiliary: Normal hepatic size and contours. No perihepatic ascites. No intra- or extrahepatic biliary dilatation. Normal gallbladder. Pancreas: Normal pancreatic contours. No peripancreatic fluid collection or pancreatic ductal dilatation. Spleen: Normal. Adrenals/Urinary Tract: Normal adrenal glands. No hydronephrosis or nephrolithiasis. No abnormal perinephric stranding. No ureteral obstruction. Stomach/Bowel: There is inflammatory stranding and free fluid adjacent to the distal stomach and pancreatic head and the first and second portion of the duodenum. The appendix is normal. No small bowel obstruction. No focal colonic abnormality. Vascular/Lymphatic: There is atherosclerotic calcification of the  non aneurysmal abdominal aorta. No abdominal or pelvic adenopathy. Reproductive: Prostate is upper limits normal. Musculoskeletal: No lytic or blastic osseous lesion. Normal visualized extrathoracic and extraperitoneal soft tissues. Other: No contributory non-categorized findings. IMPRESSION: 1. Right upper quadrant free fluid and inflammatory stranding, adjacent to the pancreatic head and proximal duodenum. This could indicate acute pancreatitis; however, duodenitis or a perforated gastric/duodenal ulcer might also cause this appearance. Correlation with laboratory values for pancreatitis is recommended. 2. Aortic atherosclerosis. Electronically Signed   By: Ulyses Jarred M.D.   On: 09/23/2016 16:38    Procedures Procedures (including critical care time)  Medications Ordered in ED Medications  lactated ringers infusion (not administered)  acetaminophen (TYLENOL) tablet 650 mg (not administered)    Or  acetaminophen (TYLENOL) suppository 650 mg (not administered)  ondansetron (ZOFRAN) tablet 4 mg (not administered)    Or  ondansetron (ZOFRAN) injection 4 mg (not administered)  insulin aspart (novoLOG) injection 0-15 Units (not administered)  insulin aspart (novoLOG) injection 0-5 Units (not administered)  morphine 2 MG/ML injection 2 mg (not administered)  nicotine (NICODERM CQ - dosed in mg/24 hours) patch 14 mg (not administered)  hydrALAZINE (APRESOLINE) injection 5 mg (not administered)  piperacillin-tazobactam (ZOSYN) IVPB 3.375 g (not administered)    Followed by  piperacillin-tazobactam (ZOSYN) 2.25 g in dextrose 5 % 50 mL IVPB (not administered)  ondansetron (ZOFRAN) injection 4 mg (4 mg Intravenous Given 09/23/16 1329)  sodium chloride 0.9 % bolus 1,000 mL (0 mLs Intravenous Stopped 09/23/16 1442)  HYDROmorphone (DILAUDID) injection 1 mg (1 mg Intravenous Given 09/23/16 1329)  sodium chloride 0.9 % bolus 1,000 mL (0 mLs Intravenous Stopped 09/23/16 1341)  iopamidol (ISOVUE-300) 61 %  injection (  Contrast Given 09/23/16 1500)  HYDROmorphone (DILAUDID) injection 1 mg (1 mg Intravenous Given 09/23/16 1605)  sodium chloride 0.9 % bolus 1,000 mL (1,000 mLs Intravenous Transfusing/Transfer 09/23/16 1909)     Initial Impression / Assessment and Plan / ED Course  I have reviewed the triage vital signs and the nursing notes.  Pertinent labs & imaging results that were available during my care of the patient were reviewed by me and considered in my medical decision making (see chart for details).    CRITICAL CARE Performed by: Nat Christen Total critical care time: 30 minutes Critical care time was exclusive of separately billable procedures and treating other patients. Critical care was necessary to treat or prevent imminent or life-threatening deterioration. Critical care was time spent personally by me on the following activities: development of treatment plan with patient and/or surrogate as well as nursing, discussions with consultants, evaluation of patient's response to treatment, examination of patient, obtaining history from patient or surrogate, ordering and performing treatments and interventions, ordering and review of laboratory studies, ordering and review of radiographic studies, pulse oximetry and re-evaluation of patient's condition.  Patient presents with lower abdominal pain, anorexia, dehydration. Lipase grossly elevated along with creatinine. CT scan confirms pancreatitis. We will vigorously hydrate and treat pain. Discussed with general surgeon Dr. Aviva Signs. Admit to general medicine. Final Clinical Impressions(s) / ED Diagnoses   Final diagnoses:  Idiopathic acute pancreatitis, unspecified complication status    New Prescriptions Current Discharge Medication List    I personally performed the services described in this documentation, which was scribed in my presence. The recorded information has been reviewed and is accurate.      Nat Christen,  MD 09/23/16 2029

## 2016-09-23 NOTE — H&P (Signed)
History and Physical    TIP ATIENZA VOH:[id number] DOB: Jun 08, 1960 DOA: 09/23/2016  PCP: Patient, No Pcp Per - last saw a doctor about 4 months ago, [facility] Consultants:  None Patient coming from: Home - lives with mother; NOK: Mother or sister  Chief Complaint: abdominal pain  HPI: John Carroll is a 56 y.o. male with medical history significant of HTN presenting with abdominal pain.  He reports that about 1 year ago, he had his teeth pulled.  He got sick and was told he had malnutrition.  He came to [facility] and reports that he was hospitalized about 4-5 days (see below for details from Epic).  He decided he wasn't going to allow himself to get in that position again, even though he can only afford certain foods.  His mother had brain surgery a couple of months ago and he spent about 1 1/2 months in the hospital with her.  "I just kinda let myself go."  When he tried to eat, he couldn't.  He "saw that it was getting out of hand and here's where I'm at".  Food didn't taste good and he would try to eat it but he would throw it up.  In the last 2 weeks, he really wasn't able to eat, only drinking chocolate Ensure.  He tried to eat but just could not eat.  He thinks he has lost 50 pounds since last hospitalization (although he weight is down 8 pounds according to today's weight).  2 days ago he developed severe LUQ abdominal pain.  Takes BP meds and One-A-Day but denies taking other OTC medications.  He was taking Flexeril but stopped that.  Still has a gallbladder (no h/o surgery).  Quit alcohol about 7 years ago, h/o heavy use.  Went to rehab to quit, "took me 3 times but I did it".  He does acknowledge intermittent marijuana use.  Last cholesterol was about 4 months ago, he was told it was okay.    He was hospitalized from 11/16-29/17.  His primary diagnoses with AKI with hyperkalemia and metabolic acidosis that resolved with volume repletion.  His Lisinopril/HCTZ was held at the  time of discharge but it does appear to have been resumed.  His creatinine on admission was 6.60 and it was down to 1/24 at the time of discharge; he did have a normal renal US during that hospitalization.  ED Course:  CT concerning for perforated ulcer with pancreatitis; Dr. Arnoldo Morale consulted and he reportedly said that surgery wouldn't be indicated without evidence of free air.  Given IVF and planned for admission.  Review of Systems: As per HPI; otherwise review of systems reviewed and negative.   Ambulatory Status:  ambulates without asssitance  Past Medical History:  Diagnosis Date  . Chronic back pain   . Epididymitis   . Hypertension     History reviewed. No pertinent surgical history.  Social History   Social History  . Marital status: Divorced    Spouse name: N/A  . Number of children: N/A  . Years of education: N/A   Occupational History  . disabled    Social History Main Topics  . Smoking status: Current Every Day Smoker    Packs/day: 0.50    Years: 27.00    Types: Cigarettes  . Smokeless tobacco: Never Used  . Alcohol use No     Comment: quit after rehab  . Drug use: Yes    Types: Marijuana  Comment: last use maybe a week or two ago, occasional use  . Sexual activity: Not on file   Other Topics Concern  . Not on file   Social History Narrative  . No narrative on file    No Known Allergies  Family History  Problem Relation Age of Onset  . Kidney disease Mother   . Hypertension Mother   . Diabetes Mellitus II Mother   . Heart attack Father 42       Died of MI in 22  . Heart attack Sister   . Hypertension Sister     Prior to Admission medications   Medication Sig Start Date End Date Taking? Authorizing Provider  lisinopril-hydrochlorothiazide (PRINZIDE,ZESTORETIC) 20-12.5 MG tablet Take 1 tablet by mouth daily. 08/24/16  Yes [provider]  Multiple Vitamin (MULTIVITAMIN WITH MINERALS) TABS tablet Take 1 tablet by mouth daily.    Yes [provider]    Physical Exam: Vitals:   09/23/16 1630 09/23/16 1800 09/23/16 1822 09/23/16 1900  BP: 126/81 (!) 145/91  (!) 141/85  Pulse: 89 89  91  Resp: 18 18    Temp:      TempSrc:      SpO2: 98% 98%  98%  Weight:   61.6 kg (135 lb 11.2 oz)   Height:         General:  Appears calm and comfortable and is NAD Eyes:  PERRL, EOMI, normal lids, iris ENT:  grossly normal hearing, lips & tongue, mmm Neck:  no LAD, masses or thyromegaly Cardiovascular:  RRR, no m/r/g. No LE edema.  Respiratory:  CTA bilaterally, no w/r/r. Normal respiratory effort. Abdomen:  Marked LUQ TTP, somewhat hypoactive BS but present in all 4 quadrants Skin:  no rash or induration seen on limited exam Musculoskeletal:  grossly normal tone BUE/BLE, good ROM, no bony abnormality Psychiatric:  grossly normal mood and affect, speech fluent and appropriate, AOx3 Neurologic:  CN 2-12 grossly intact, moves all extremities in coordinated fashion, sensation intact  Labs on Admission: I have personally reviewed following labs and imaging studies  CBC:  Recent Labs Lab 09/23/16 1222  WBC 10.4  HGB 12.9*  HCT 37.5*  MCV 92.4  PLT 557   Basic Metabolic Panel:  Recent Labs Lab 09/23/16 1222  NA 132*  K 5.3*  CL 102  CO2 18*  GLUCOSE 150*  BUN 132*  CREATININE 5.57*  CALCIUM 8.9   GFR: Estimated Creatinine Clearance: 12.7 mL/min (A) (by C-G formula based on SCr of 5.57 mg/dL (H)). Liver Function Tests:  Recent Labs Lab 09/23/16 1222  AST 15  ALT 10*  ALKPHOS 73  BILITOT 0.7  PROT 7.2  ALBUMIN 4.0    Recent Labs Lab 09/23/16 1222  LIPASE 2,197*   No results for input(s): AMMONIA in the last 168 hours. Coagulation Profile: No results for input(s): INR, PROTIME in the last 168 hours. Cardiac Enzymes: No results for input(s): CKTOTAL, CKMB, CKMBINDEX, TROPONINI in the last 168 hours. BNP (last 3 results) No results for input(s): PROBNP in the last 8760  hours. HbA1C: No results for input(s): HGBA1C in the last 72 hours. CBG: No results for input(s): GLUCAP in the last 168 hours. Lipid Profile: No results for input(s): CHOL, HDL, LDLCALC, TRIG, CHOLHDL, LDLDIRECT in the last 72 hours. Thyroid Function Tests: No results for input(s): TSH, T4TOTAL, FREET4, T3FREE, THYROIDAB in the last 72 hours. Anemia Panel: No results for input(s): VITAMINB12, FOLATE, FERRITIN, TIBC, IRON, RETICCTPCT in the last 72 hours. Urine  analysis:    Component Value Date/Time   COLORURINE YELLOW 09/23/2016 1221   APPEARANCEUR HAZY (A) 09/23/2016 1221   LABSPEC 1.012 09/23/2016 1221   PHURINE 5.0 09/23/2016 1221   GLUCOSEU NEGATIVE 09/23/2016 1221   HGBUR NEGATIVE 09/23/2016 1221   BILIRUBINUR NEGATIVE 09/23/2016 1221   KETONESUR 5 (A) 09/23/2016 1221   PROTEINUR 30 (A) 09/23/2016 1221   UROBILINOGEN 0.2 12/02/2012 0913   NITRITE NEGATIVE 09/23/2016 1221   LEUKOCYTESUR NEGATIVE 09/23/2016 1221    Creatinine Clearance: Estimated Creatinine Clearance: 12.7 mL/min (A) (by C-G formula based on SCr of 5.57 mg/dL (H)).  Sepsis Labs: @LABRCNTIP (procalcitonin:4,lacticidven:4) )No results found for this or any previous visit (from the past 240 hour(s)).   Radiological Exams on Admission: Ct Abdomen Pelvis Wo Contrast  Result Date: 09/23/2016 CLINICAL DATA:  Abdominal pain EXAM: CT ABDOMEN AND PELVIS WITHOUT CONTRAST TECHNIQUE: Multidetector CT imaging of the abdomen and pelvis was performed following the standard protocol without IV contrast. COMPARISON:  None. FINDINGS: Lower chest: No pulmonary nodules. No visible pleural or pericardial effusion. Hepatobiliary: Normal hepatic size and contours. No perihepatic ascites. No intra- or extrahepatic biliary dilatation. Normal gallbladder. Pancreas: Normal pancreatic contours. No peripancreatic fluid collection or pancreatic ductal dilatation. Spleen: Normal. Adrenals/Urinary Tract: Normal adrenal glands. No  hydronephrosis or nephrolithiasis. No abnormal perinephric stranding. No ureteral obstruction. Stomach/Bowel: There is inflammatory stranding and free fluid adjacent to the distal stomach and pancreatic head and the first and second portion of the duodenum. The appendix is normal. No small bowel obstruction. No focal colonic abnormality. Vascular/Lymphatic: There is atherosclerotic calcification of the non aneurysmal abdominal aorta. No abdominal or pelvic adenopathy. Reproductive: Prostate is upper limits normal. Musculoskeletal: No lytic or blastic osseous lesion. Normal visualized extrathoracic and extraperitoneal soft tissues. Other: No contributory non-categorized findings. IMPRESSION: 1. Right upper quadrant free fluid and inflammatory stranding, adjacent to the pancreatic head and proximal duodenum. This could indicate acute pancreatitis; however, duodenitis or a perforated gastric/duodenal ulcer might also cause this appearance. Correlation with laboratory values for pancreatitis is recommended. 2. Aortic atherosclerosis. Electronically Signed   By: Ulyses Jarred M.D.   On: 09/23/2016 16:38    EKG: not done  Assessment/Plan Principal Problem:   Pancreatitis Active Problems:   Acute renal failure (ARF) ([place])   Hypertension   Marijuana abuse   Tobacco abuse   Hyperglycemia   Pancreatitis -Now with frank pancreatitis by H&P, elevated lipase (2197), and positive CT -With normal LFTs, seems unlikely to have a retained stone but this is still possible. -CT did list possible perforated ulcer - this seems less likely but will continue Zosyn for now and Dr. Arnoldo Morale will see the patient tomorrow. -Will admit, as he is likely to remain in the hospital through the weekend to allow the inflammation to cool  -Strict NPO for now -Aggressive IVF hydration at least for the first 12 hours with LR at 200 cc/hr -Pain control with morphine 2 mg q2h prn -Nausea control with Zofran -The 4 most likely causes  for pancreatitis are:  -Gall stone pancreatitis - US ordered by ER and is pending, no known h/o gallstones  -Medications - he is taking HCTZ which is a known precipitant (see below)  -ETOH - denies use.  He is continuing to use marijuana intermittently.  Will check ETOH level and UDS.  -Hypertriglyceridemia - reports fairly recent and reportedly normal cholesterol screening.  Will order triglycerides level.  Acute renal failure -BUN 132/Creatinine 5.57; prior 29/1.24 on 03/31/16 -Has h/o this in  11/17 and had a normal renal US at that time -He does have very mild hyperkalemia now, K+ 5.2; will monitor without intervention as this is likely to improve with aggressive hydration and improvement in creatinine -IVF as above at 200 cc/hr for now -likely due to prerenal secondary to severe dehydration -Follow up renal function by BMP -Avoid ACEI and NSAIDs  HTN -Given now 2 episodes of renal failure requiring hospitalization since Nov 2017, would recommend discontinuation of ACE and HCTZ permanently (although if CKD then he may benefit from a low dose of ACE for renal protection in the future) -Will add prn hydralazine for SBP >170  Hyperglycemia -Glucose 150 -Check A1c -He may have pancreatic dysfunction acutely and/or chronically that limits his ability to control his blood sugar for now.  Will add SSI.  Marijuana and tobacco abuse -Encourage cessation.  This was discussed with the patient and should be reviewed on an ongoing basis.   -Nicotine patch ordered at patient request.   DVT prophylaxis:  SCDs for now just in case of perforated ulcer Code Status:  Full - confirmed with patient Family Communication: None present  Disposition Plan:  Home once clinically improved Consults called: Surgery  Admission status: Admit - It is my clinical opinion that admission to INPATIENT is reasonable and necessary because this patient will require at least 2 midnights in the hospital to treat this  condition based on the medical complexity of the problems presented.  Given the aforementioned information, the predictability of an adverse outcome is felt to be significant.    Karmen Bongo MD [facility]  If 7PM-7AM, please contact night-coverage [email or web address] Password [facility]  09/23/2016, 7:21 PM

## 2016-09-23 NOTE — ED Triage Notes (Signed)
Poor oral intake for last 2 weeks.  Started with lower abdominal pain yesterday, (cramping.  Only been able to drink ensure.  Rates pain 10/10.  Brought in by [facility], 93% on RA, Glucose 166, BP 150/86 HR 88.  Pt c/o feeling dehydrated, lethargic and abdominal pain.

## 2016-09-24 ENCOUNTER — Inpatient Hospital Stay (HOSPITAL_COMMUNITY): Payer: Medicaid Other

## 2016-09-24 DIAGNOSIS — K859 Acute pancreatitis without necrosis or infection, unspecified: Secondary | ICD-10-CM

## 2016-09-24 LAB — CBC
HCT: 36 % — ABNORMAL LOW (ref 39.0–52.0)
Hemoglobin: 12.5 g/dL — ABNORMAL LOW (ref 13.0–17.0)
MCH: 31.9 pg (ref 26.0–34.0)
MCHC: 34.7 g/dL (ref 30.0–36.0)
MCV: 91.8 fL (ref 78.0–100.0)
PLATELETS: 192 10*3/uL (ref 150–400)
RBC: 3.92 MIL/uL — AB (ref 4.22–5.81)
RDW: 13.3 % (ref 11.5–15.5)
WBC: 11.5 10*3/uL — ABNORMAL HIGH (ref 4.0–10.5)

## 2016-09-24 LAB — GLUCOSE, CAPILLARY
GLUCOSE-CAPILLARY: 56 mg/dL — AB (ref 65–99)
GLUCOSE-CAPILLARY: 98 mg/dL (ref 65–99)
Glucose-Capillary: 91 mg/dL (ref 65–99)
Glucose-Capillary: 94 mg/dL (ref 65–99)

## 2016-09-24 LAB — COMPREHENSIVE METABOLIC PANEL
ALT: 11 U/L — AB (ref 17–63)
AST: 45 U/L — AB (ref 15–41)
Albumin: 3.4 g/dL — ABNORMAL LOW (ref 3.5–5.0)
Alkaline Phosphatase: 67 U/L (ref 38–126)
Anion gap: 10 (ref 5–15)
BUN: 104 mg/dL — ABNORMAL HIGH (ref 6–20)
CHLORIDE: 108 mmol/L (ref 101–111)
CO2: 18 mmol/L — AB (ref 22–32)
CREATININE: 3.26 mg/dL — AB (ref 0.61–1.24)
Calcium: 8.9 mg/dL (ref 8.9–10.3)
GFR, EST AFRICAN AMERICAN: 23 mL/min — AB (ref >60)
GFR, EST NON AFRICAN AMERICAN: 20 mL/min — AB (ref >60)
Glucose, Bld: 93 mg/dL (ref 65–99)
Potassium: 4.7 mmol/L (ref 3.5–5.1)
SODIUM: 136 mmol/L (ref 135–145)
Total Bilirubin: 0.8 mg/dL (ref 0.3–1.2)
Total Protein: 6.4 g/dL — ABNORMAL LOW (ref 6.5–8.1)

## 2016-09-24 MED ORDER — PIPERACILLIN-TAZOBACTAM IN DEX 2-0.25 GM/50ML IV SOLN
INTRAVENOUS | Status: AC
  Filled 2016-09-24: qty 50

## 2016-09-24 MED ORDER — GLUCOSE 40 % PO GEL
ORAL | Status: AC
  Administered 2016-09-24: 37.5 g
  Filled 2016-09-24: qty 1

## 2016-09-24 NOTE — Progress Notes (Signed)
Patients blood sugar @56 . Glucose gel 15 given. RN will continue to monitor blood sugar  As shift progresses

## 2016-09-24 NOTE — Progress Notes (Signed)
Patient Demographics:    John Carroll, is a 56 y.o. male, DOB - 04/04/1961, OLM:[id number]  Admit date - 09/23/2016   Admitting Physician Karmen Bongo, MD  Outpatient Primary MD for the patient is Patient, No Pcp Per  LOS - 1  Chief Complaint  Patient presents with  . Dehydration  . Abdominal Pain        Subjective:    [name] today has no fevers, no emesis,  No chest pain,  abd pain is better   Assessment  & Plan :    Principal Problem:   Pancreatitis Active Problems:   Acute renal failure (ARF) ([facility])   Hypertension   Marijuana abuse   Tobacco abuse   Hyperglycemia  1)Acute Pancreatitis- CT abdomen and ultrasound suggesting pancreatitis, continue IV fluids, bowel rest, when necessary Zofran , triglycerides are not elevated, no evidence of gallstones on abdominal imaging . Lipase was over 2100 , patient denies ongoing alcohol use. Clinically and radiologically perforated viscus thought to be less likely, we will await further input from surgery as to whether we should discontinue Zosyn  2)Acute renal failure- suspect secondary to prerenal azotemia in the setting of severe dehydration, creatinine is down to 3.2 from 5.5   3)HTN- HCTZ and lisinopril has been discontinued due to renal concerns,  may use IV Hydralazine 10 mg  Every 4 hours Prn for systolic blood pressure over 160 mmhg    4)Hyperglycemia- in the setting of recurrent pancreatitis, A1c pending, Use Novolog/Humalog Sliding scale insulin with Accu-Cheks/Fingersticks as ordered   5)Tobacco USE Disorder-Smoking cessation counseling offered, consider Nicotine replacement therapy   DVT prophylaxis:  SCDs  Code Status:  Full -   Family Communication: None present  Disposition Plan:  Home once clinically improved Consults called: Surgery    Lab Results  Component Value Date   PLT 192 09/24/2016    Inpatient  Medications  Scheduled Meds: . insulin aspart  0-15 Units Subcutaneous TID WC  . insulin aspart  0-5 Units Subcutaneous QHS  . nicotine  14 mg Transdermal Daily   Continuous Infusions: . lactated ringers 200 mL/hr at 09/24/16 1045  . piperacillin-tazobactam (ZOSYN)  IV 2.25 g (09/24/16 1315)   PRN Meds:.acetaminophen **OR** acetaminophen, hydrALAZINE, morphine injection, ondansetron **OR** ondansetron (ZOFRAN) IV    Anti-infectives    Start     Dose/Rate Route Frequency Ordered Stop   09/24/16 0400  piperacillin-tazobactam (ZOSYN) 2.25 g in dextrose 5 % 50 mL IVPB     2.25 g 100 mL/hr over 30 Minutes Intravenous Every 8 hours 09/23/16 1958     09/23/16 2000  piperacillin-tazobactam (ZOSYN) IVPB 3.375 g     3.375 g 100 mL/hr over 30 Minutes Intravenous  Once 09/23/16 1958 09/23/16 2353   09/23/16 1722  piperacillin-tazobactam (ZOSYN) 3.375 GM/50ML IVPB  Status:  Discontinued    Comments:  Charmayne Sheer   : cabinet override      09/23/16 1722 09/23/16 1804        Objective:   Vitals:   09/23/16 1900 09/23/16 2019 09/24/16 0619 09/24/16 1300  BP: (!) 141/85 (!) 142/87 98/68 117/75  Pulse: 91 (!) 101 96 78  Resp:  18 18 18   Temp:  98.1 F (36.7 C) 98.2 F (36.8 C)  98.3 F (36.8 C)  TempSrc:  Oral Oral Oral  SpO2: 98% 99% 98% 99%  Weight:   61.4 kg (135 lb 6.4 oz)   Height:        Wt Readings from Last 3 Encounters:  09/24/16 61.4 kg (135 lb 6.4 oz)  03/31/16 65 kg (143 lb 6.4 oz)  12/02/12 86.2 kg (190 lb)     Intake/Output Summary (Last 24 hours) at 09/24/16 1350 Last data filed at 09/24/16 1200  Gross per 24 hour  Intake             1050 ml  Output             2600 ml  Net            -1550 ml     Physical Exam  Gen:- Awake Alert,  In no apparent distress  HEENT:- Cross.AT, No sclera icterus Neck-Supple Neck,No JVD,.  Lungs-  CTAB  CV- S1, S2 normal Abd-  +ve B.Sounds, Abd Soft, generalized  upper quadrants and epigastric area tenderness, no rebound  or guarding,    Extremity/Skin:- No  edema,       Data Review:   Micro Results No results found for this or any previous visit (from the past 240 hour(s)).  Radiology Reports Ct Abdomen Pelvis Wo Contrast  Result Date: 09/23/2016 CLINICAL DATA:  Abdominal pain EXAM: CT ABDOMEN AND PELVIS WITHOUT CONTRAST TECHNIQUE: Multidetector CT imaging of the abdomen and pelvis was performed following the standard protocol without IV contrast. COMPARISON:  None. FINDINGS: Lower chest: No pulmonary nodules. No visible pleural or pericardial effusion. Hepatobiliary: Normal hepatic size and contours. No perihepatic ascites. No intra- or extrahepatic biliary dilatation. Normal gallbladder. Pancreas: Normal pancreatic contours. No peripancreatic fluid collection or pancreatic ductal dilatation. Spleen: Normal. Adrenals/Urinary Tract: Normal adrenal glands. No hydronephrosis or nephrolithiasis. No abnormal perinephric stranding. No ureteral obstruction. Stomach/Bowel: There is inflammatory stranding and free fluid adjacent to the distal stomach and pancreatic head and the first and second portion of the duodenum. The appendix is normal. No small bowel obstruction. No focal colonic abnormality. Vascular/Lymphatic: There is atherosclerotic calcification of the non aneurysmal abdominal aorta. No abdominal or pelvic adenopathy. Reproductive: Prostate is upper limits normal. Musculoskeletal: No lytic or blastic osseous lesion. Normal visualized extrathoracic and extraperitoneal soft tissues. Other: No contributory non-categorized findings. IMPRESSION: 1. Right upper quadrant free fluid and inflammatory stranding, adjacent to the pancreatic head and proximal duodenum. This could indicate acute pancreatitis; however, duodenitis or a perforated gastric/duodenal ulcer might also cause this appearance. Correlation with laboratory values for pancreatitis is recommended. 2. Aortic atherosclerosis. Electronically Signed   By: Ulyses Jarred M.D.   On: 09/23/2016 16:38   US Abdomen Limited Ruq  Result Date: 09/24/2016 CLINICAL DATA:  Pancreatitis EXAM: US ABDOMEN LIMITED - RIGHT UPPER QUADRANT COMPARISON:  CT 09/23/2016 FINDINGS: Gallbladder: No gallstones or wall thickening visualized. No sonographic Murphy sign noted by sonographer. Common bile duct: Diameter: borderline diameter, 7 mm. Liver: Mildly increased echotexture.  No focal hepatic abnormality. IMPRESSION: Borderline common bile duct diameter, likely related to pancreatitis. Suspect early fatty infiltration of the liver. Electronically Signed   By: Rolm Baptise M.D.   On: 09/24/2016 08:30      CBC  Recent Labs Lab 09/23/16 1222 09/24/16 0423  WBC 10.4 11.5*  HGB 12.9* 12.5*  HCT 37.5* 36.0*  PLT 208 192  MCV 92.4 91.8  MCH 31.8 31.9  MCHC 34.4 34.7  RDW 12.8 13.3  Chemistries   Recent Labs Lab 09/23/16 1222 09/24/16 0423  NA 132* 136  K 5.3* 4.7  CL 102 108  CO2 18* 18*  GLUCOSE 150* 93  BUN 132* 104*  CREATININE 5.57* 3.26*  CALCIUM 8.9 8.9  AST 15 45*  ALT 10* 11*  ALKPHOS 73 67  BILITOT 0.7 0.8   ------------------------------------------------------------------------------------------------------------------  Recent Labs  09/23/16 1227  TRIG 108    No results found for: HGBA1C ------------------------------------------------------------------------------------------------------------------ No results for input(s): TSH, T4TOTAL, T3FREE, THYROIDAB in the last 72 hours.  Invalid input(s): FREET3 ------------------------------------------------------------------------------------------------------------------ No results for input(s): VITAMINB12, FOLATE, FERRITIN, TIBC, IRON, RETICCTPCT in the last 72 hours.  Coagulation profile No results for input(s): INR, PROTIME in the last 168 hours.  No results for input(s): DDIMER in the last 72 hours.  Cardiac Enzymes No results for input(s): CKMB, TROPONINI, MYOGLOBIN in the  last 168 hours.  Invalid input(s): CK ------------------------------------------------------------------------------------------------------------------ No results found for: BNP   Rudell Ortman M.D on 09/24/2016 at 1:50 PM  Between 7am to 7pm - Pager - [phone]  After 7pm go to [email or web address] - password [email or web address]  [facility] -  Office  [phone]   Voice Recognition Viviann Spare dictation system was used to create this note, attempts have been made to correct errors. Please contact the author with questions and/or clarifications.

## 2016-09-24 NOTE — Care Management Note (Signed)
Case Management Note  Patient Details  Name: John Carroll MRN: [id number] Date of Birth: 10-Mar-1961  Subjective/Objective:   Adm with pancreatitis. From home, mother lives with him. Ind PTA, goes to [facility] for primary care. Has medicaid and reports no issues affording medications.                 Action/Plan: Anticipate DC home with self care. No CM needs.    Expected Discharge Date:     09/26/2016           Expected Discharge Plan:  Home/Self Care  In-House Referral:     Discharge planning Services  CM Consult  Post Acute Care Choice:    Choice offered to:  NA  DME Arranged:    DME Agency:     HH Arranged:    HH Agency:     Status of Service:  Completed, signed off  If discussed at [employer] of Stay Meetings, dates discussed:    Additional Comments:  Vida Nicol, Chauncey Reading, RN 09/24/2016, 12:09 PM

## 2016-09-24 NOTE — Consult Note (Signed)
Reason for Consult: Pancreatitis Referring Physician: Dr. Antonietta Jewel is an 56 y.o. male.  HPI: Patient is a 56 year old white male who presented to the emergency room with acute onset of epigastric and left lower quadrant abdominal pain. CT scan of the abdomen revealed duodenitis with fluid in the right upper quadrant. No free air was noted. Laboratory tests revealed a significantly elevated lipase. His white blood cell count was within normal limits. Liver enzyme tests were within normal limits. He has a remote history of drinking, but denies any recent alcohol use. He denies nonsteroidal anti-inflammatory drugs, BC powders. No hematemesis has been noted. He was made to the hospital for further evaluation and treatment. He states he still has epigastric and right upper quadrant abdominal pain, his lower abdominal pain seems to have resolved. His pain is 5 out of 10. He denies any history of fatty food intolerance, fever, chills, jaundice.  Past Medical History:  Diagnosis Date  . Chronic back pain   . Epididymitis   . Hypertension     History reviewed. No pertinent surgical history.  Family History  Problem Relation Age of Onset  . Kidney disease Mother   . Hypertension Mother   . Diabetes Mellitus II Mother   . Heart attack Father 28       Died of MI in 29  . Heart attack Sister   . Hypertension Sister     Social History:  reports that he has been smoking Cigarettes.  He has a 13.50 pack-year smoking history. He has never used smokeless tobacco. He reports that he uses drugs, including Marijuana. He reports that he does not drink alcohol.  Allergies: No Known Allergies  Medications:  Scheduled: . insulin aspart  0-15 Units Subcutaneous TID WC  . insulin aspart  0-5 Units Subcutaneous QHS  . nicotine  14 mg Transdermal Daily    Results for orders placed or performed during the hospital encounter of 09/23/16 (from the past 48 hour(s))  Urinalysis, Routine w  reflex microscopic     Status: Abnormal   Collection Time: 09/23/16 12:21 PM  Result Value Ref Range   Color, Urine YELLOW YELLOW   APPearance HAZY (A) CLEAR   Specific Gravity, Urine 1.012 1.005 - 1.030   pH 5.0 5.0 - 8.0   Glucose, UA NEGATIVE NEGATIVE mg/dL   Hgb urine dipstick NEGATIVE NEGATIVE   Bilirubin Urine NEGATIVE NEGATIVE   Ketones, ur 5 (A) NEGATIVE mg/dL   Protein, ur 30 (A) NEGATIVE mg/dL   Nitrite NEGATIVE NEGATIVE   Leukocytes, UA NEGATIVE NEGATIVE   RBC / HPF 0-5 0 - 5 RBC/hpf   WBC, UA 0-5 0 - 5 WBC/hpf   Bacteria, UA RARE (A) NONE SEEN   Squamous Epithelial / LPF 0-5 (A) NONE SEEN   Mucous PRESENT   Lipase, blood     Status: Abnormal   Collection Time: 09/23/16 12:22 PM  Result Value Ref Range   Lipase 2,197 (H) 11 - 51 U/L    Comment: RESULTS CONFIRMED BY MANUAL DILUTION  Comprehensive metabolic panel     Status: Abnormal   Collection Time: 09/23/16 12:22 PM  Result Value Ref Range   Sodium 132 (L) 135 - 145 mmol/L   Potassium 5.3 (H) 3.5 - 5.1 mmol/L   Chloride 102 101 - 111 mmol/L   CO2 18 (L) 22 - 32 mmol/L   Glucose, Bld 150 (H) 65 - 99 mg/dL   BUN 132 (H) 6 - 20 mg/dL  Comment: RESULTS CONFIRMED BY MANUAL DILUTION   Creatinine, Ser 5.57 (H) 0.61 - 1.24 mg/dL   Calcium 8.9 8.9 - 10.3 mg/dL   Total Protein 7.2 6.5 - 8.1 g/dL   Albumin 4.0 3.5 - 5.0 g/dL   AST 15 15 - 41 U/L   ALT 10 (L) 17 - 63 U/L   Alkaline Phosphatase 73 38 - 126 U/L   Total Bilirubin 0.7 0.3 - 1.2 mg/dL   GFR calc non Af Amer 10 (L) >60 mL/min   GFR calc Af Amer 12 (L) >60 mL/min    Comment: (NOTE) The eGFR has been calculated using the CKD EPI equation. This calculation has not been validated in all clinical situations. eGFR's persistently <60 mL/min signify possible Chronic Kidney Disease.    Anion gap 12 5 - 15  CBC     Status: Abnormal   Collection Time: 09/23/16 12:22 PM  Result Value Ref Range   WBC 10.4 4.0 - 10.5 K/uL   RBC 4.06 (L) 4.22 - 5.81 MIL/uL    Hemoglobin 12.9 (L) 13.0 - 17.0 g/dL   HCT 37.5 (L) 39.0 - 52.0 %   MCV 92.4 78.0 - 100.0 fL   MCH 31.8 26.0 - 34.0 pg   MCHC 34.4 30.0 - 36.0 g/dL   RDW 12.8 11.5 - 15.5 %   Platelets 208 150 - 400 K/uL  Triglycerides     Status: None   Collection Time: 09/23/16 12:27 PM  Result Value Ref Range   Triglycerides 108 <150 mg/dL  Ethanol     Status: None   Collection Time: 09/23/16  8:25 PM  Result Value Ref Range   Alcohol, Ethyl (B) <5 <5 mg/dL    Comment:        LOWEST DETECTABLE LIMIT FOR SERUM ALCOHOL IS 5 mg/dL FOR MEDICAL PURPOSES ONLY   Urine rapid drug screen (hosp performed)     Status: Abnormal   Collection Time: 09/23/16  8:30 PM  Result Value Ref Range   Opiates POSITIVE (A) NONE DETECTED   Cocaine NONE DETECTED NONE DETECTED   Benzodiazepines POSITIVE (A) NONE DETECTED   Amphetamines NONE DETECTED NONE DETECTED   Tetrahydrocannabinol POSITIVE (A) NONE DETECTED   Barbiturates NONE DETECTED NONE DETECTED    Comment:        DRUG SCREEN FOR MEDICAL PURPOSES ONLY.  IF CONFIRMATION IS NEEDED FOR ANY PURPOSE, NOTIFY LAB WITHIN 5 DAYS.        LOWEST DETECTABLE LIMITS FOR URINE DRUG SCREEN Drug Class       Cutoff (ng/mL) Amphetamine      1000 Barbiturate      200 Benzodiazepine   754 Tricyclics       492 Opiates          300 Cocaine          300 THC              50   Glucose, capillary     Status: None   Collection Time: 09/23/16 11:36 PM  Result Value Ref Range   Glucose-Capillary 82 65 - 99 mg/dL  Glucose, capillary     Status: None   Collection Time: 09/24/16 12:04 AM  Result Value Ref Range   Glucose-Capillary 91 65 - 99 mg/dL   Comment 1 Notify RN    Comment 2 Document in Chart   Comprehensive metabolic panel     Status: Abnormal   Collection Time: 09/24/16  4:23 AM  Result Value Ref Range  Sodium 136 135 - 145 mmol/L   Potassium 4.7 3.5 - 5.1 mmol/L   Chloride 108 101 - 111 mmol/L   CO2 18 (L) 22 - 32 mmol/L   Glucose, Bld 93 65 - 99 mg/dL    BUN 104 (H) 6 - 20 mg/dL   Creatinine, Ser 3.26 (H) 0.61 - 1.24 mg/dL    Comment: DELTA CHECK NOTED   Calcium 8.9 8.9 - 10.3 mg/dL   Total Protein 6.4 (L) 6.5 - 8.1 g/dL   Albumin 3.4 (L) 3.5 - 5.0 g/dL   AST 45 (H) 15 - 41 U/L   ALT 11 (L) 17 - 63 U/L   Alkaline Phosphatase 67 38 - 126 U/L   Total Bilirubin 0.8 0.3 - 1.2 mg/dL   GFR calc non Af Amer 20 (L) >60 mL/min   GFR calc Af Amer 23 (L) >60 mL/min    Comment: (NOTE) The eGFR has been calculated using the CKD EPI equation. This calculation has not been validated in all clinical situations. eGFR's persistently <60 mL/min signify possible Chronic Kidney Disease.    Anion gap 10 5 - 15  CBC     Status: Abnormal   Collection Time: 09/24/16  4:23 AM  Result Value Ref Range   WBC 11.5 (H) 4.0 - 10.5 K/uL   RBC 3.92 (L) 4.22 - 5.81 MIL/uL   Hemoglobin 12.5 (L) 13.0 - 17.0 g/dL   HCT 36.0 (L) 39.0 - 52.0 %   MCV 91.8 78.0 - 100.0 fL   MCH 31.9 26.0 - 34.0 pg   MCHC 34.7 30.0 - 36.0 g/dL   RDW 13.3 11.5 - 15.5 %   Platelets 192 150 - 400 K/uL  Glucose, capillary     Status: None   Collection Time: 09/24/16  9:10 AM  Result Value Ref Range   Glucose-Capillary 94 65 - 99 mg/dL   Comment 1 Notify RN    Comment 2 Document in Chart   Glucose, capillary     Status: None   Collection Time: 09/24/16 11:02 AM  Result Value Ref Range   Glucose-Capillary 98 65 - 99 mg/dL   Comment 1 Notify RN    Comment 2 Document in Chart     Ct Abdomen Pelvis Wo Contrast  Result Date: 09/23/2016 CLINICAL DATA:  Abdominal pain EXAM: CT ABDOMEN AND PELVIS WITHOUT CONTRAST TECHNIQUE: Multidetector CT imaging of the abdomen and pelvis was performed following the standard protocol without IV contrast. COMPARISON:  None. FINDINGS: Lower chest: No pulmonary nodules. No visible pleural or pericardial effusion. Hepatobiliary: Normal hepatic size and contours. No perihepatic ascites. No intra- or extrahepatic biliary dilatation. Normal gallbladder. Pancreas:  Normal pancreatic contours. No peripancreatic fluid collection or pancreatic ductal dilatation. Spleen: Normal. Adrenals/Urinary Tract: Normal adrenal glands. No hydronephrosis or nephrolithiasis. No abnormal perinephric stranding. No ureteral obstruction. Stomach/Bowel: There is inflammatory stranding and free fluid adjacent to the distal stomach and pancreatic head and the first and second portion of the duodenum. The appendix is normal. No small bowel obstruction. No focal colonic abnormality. Vascular/Lymphatic: There is atherosclerotic calcification of the non aneurysmal abdominal aorta. No abdominal or pelvic adenopathy. Reproductive: Prostate is upper limits normal. Musculoskeletal: No lytic or blastic osseous lesion. Normal visualized extrathoracic and extraperitoneal soft tissues. Other: No contributory non-categorized findings. IMPRESSION: 1. Right upper quadrant free fluid and inflammatory stranding, adjacent to the pancreatic head and proximal duodenum. This could indicate acute pancreatitis; however, duodenitis or a perforated gastric/duodenal ulcer might also cause this appearance. Correlation with  laboratory values for pancreatitis is recommended. 2. Aortic atherosclerosis. Electronically Signed   By: Ulyses Jarred M.D.   On: 09/23/2016 16:38   US Abdomen Limited Ruq  Result Date: 09/24/2016 CLINICAL DATA:  Pancreatitis EXAM: US ABDOMEN LIMITED - RIGHT UPPER QUADRANT COMPARISON:  CT 09/23/2016 FINDINGS: Gallbladder: No gallstones or wall thickening visualized. No sonographic Murphy sign noted by sonographer. Common bile duct: Diameter: borderline diameter, 7 mm. Liver: Mildly increased echotexture.  No focal hepatic abnormality. IMPRESSION: Borderline common bile duct diameter, likely related to pancreatitis. Suspect early fatty infiltration of the liver. Electronically Signed   By: Rolm Baptise M.D.   On: 09/24/2016 08:30    ROS:  Pertinent items noted in HPI and remainder of comprehensive  ROS otherwise negative.  Blood pressure 98/68, pulse 96, temperature 98.2 F (36.8 C), temperature source Oral, resp. rate 18, height _0  (1.702 m), weight 135 lb 6.4 oz (61.4 kg), SpO2 98 %. Physical Exam: Pleasant well-developed well-nourished white male no acute distress. Head is normocephalic, atraumatic Eyes are without scleral icterus Neck is supple without lymphadenopathy Lungs clear auscultation with equal breath sounds bilaterally Heart examination reveals a regular rate and rhythm without S3, S4, murmurs Abdomen is soft with tenderness noted in the epigastric and right upper quadrant region. No rigidity is noted. Bowel sounds are present. No hernias are noted.  CT scan images personally reviewed H&P reviewed  Assessment/Plan: Impression: Pancreatitis. Acute renal failure. Patient could also have duodenitis with peptic ulcer disease, of which has eroded towards the pancreas. Gallstone pancreatitis less likely. No need for acute surgical intervention at this time. Plan: We'll get ultrasound of right upper quadrant to rule out cholelithiasis. Would continue supportive care for his pancreatitis at this time. Will follow with you.  Aviva Signs 09/24/2016, 11:39 AM

## 2016-09-25 DIAGNOSIS — R109 Unspecified abdominal pain: Secondary | ICD-10-CM

## 2016-09-25 LAB — COMPREHENSIVE METABOLIC PANEL
ALT: 11 U/L — ABNORMAL LOW (ref 17–63)
AST: 42 U/L — ABNORMAL HIGH (ref 15–41)
Albumin: 3 g/dL — ABNORMAL LOW (ref 3.5–5.0)
Alkaline Phosphatase: 60 U/L (ref 38–126)
Anion gap: 10 (ref 5–15)
BUN: 57 mg/dL — ABNORMAL HIGH (ref 6–20)
CHLORIDE: 108 mmol/L (ref 101–111)
CO2: 23 mmol/L (ref 22–32)
CREATININE: 1.85 mg/dL — AB (ref 0.61–1.24)
Calcium: 9 mg/dL (ref 8.9–10.3)
GFR, EST AFRICAN AMERICAN: 45 mL/min — AB (ref >60)
GFR, EST NON AFRICAN AMERICAN: 39 mL/min — AB (ref >60)
Glucose, Bld: 56 mg/dL — ABNORMAL LOW (ref 65–99)
POTASSIUM: 5.1 mmol/L (ref 3.5–5.1)
Sodium: 141 mmol/L (ref 135–145)
TOTAL PROTEIN: 5.8 g/dL — AB (ref 6.5–8.1)
Total Bilirubin: 1 mg/dL (ref 0.3–1.2)

## 2016-09-25 LAB — HEMOGLOBIN A1C
Hgb A1c MFr Bld: 5.7 % — ABNORMAL HIGH (ref 4.8–5.6)
MEAN PLASMA GLUCOSE: 117 mg/dL

## 2016-09-25 LAB — CBC
HCT: 32.4 % — ABNORMAL LOW (ref 39.0–52.0)
Hemoglobin: 11.1 g/dL — ABNORMAL LOW (ref 13.0–17.0)
MCH: 31.9 pg (ref 26.0–34.0)
MCHC: 34.3 g/dL (ref 30.0–36.0)
MCV: 93.1 fL (ref 78.0–100.0)
PLATELETS: 164 10*3/uL (ref 150–400)
RBC: 3.48 MIL/uL — ABNORMAL LOW (ref 4.22–5.81)
RDW: 13.7 % (ref 11.5–15.5)
WBC: 9.1 10*3/uL (ref 4.0–10.5)

## 2016-09-25 LAB — GLUCOSE, CAPILLARY
GLUCOSE-CAPILLARY: 138 mg/dL — AB (ref 65–99)
GLUCOSE-CAPILLARY: 71 mg/dL (ref 65–99)
Glucose-Capillary: 79 mg/dL (ref 65–99)
Glucose-Capillary: 69 mg/dL (ref 65–99)
Glucose-Capillary: 80 mg/dL (ref 65–99)

## 2016-09-25 LAB — HIV ANTIBODY (ROUTINE TESTING W REFLEX): HIV Screen 4th Generation wRfx: NONREACTIVE

## 2016-09-25 LAB — LIPASE, BLOOD: LIPASE: 416 U/L — AB (ref 11–51)

## 2016-09-25 NOTE — Progress Notes (Signed)
Patient Demographics:    John Carroll, is a 56 y.o. male, DOB - [birth year], KZS:[id number]  Admit date - 09/23/2016   Admitting Physician Karmen Bongo, MD  Outpatient Primary MD for the patient is Patient, No Pcp Per  LOS - 2   Chief Complaint  Patient presents with  . Dehydration  . Abdominal Pain        Subjective:    John Carroll today has no fevers, no emesis,  No chest pain,  Wants to eat more, abd pain is resolving   Assessment  & Plan :    Principal Problem:   Pancreatitis Active Problems:   Acute renal failure (ARF) (HCC)   Hypertension   Marijuana abuse   Tobacco abuse   Hyperglycemia   1)Acute Pancreatitis- CT abdomen and ultrasound suggesting pancreatitis, Clinically improving, patient would like to eat more, lipase is down to 416 from 2197,  continue IV fluids,  when necessary Zofran , triglycerides are not elevated, no evidence of gallstones on abdominal imaging . Lipase was over 2100 , patient denies ongoing alcohol use. Clinically and radiologically perforated viscus thought to be less likely,  discontinue Zosyn  2)Acute renal failure- suspect secondary to prerenal azotemia in the setting of severe dehydration, creatinine is down to 1.85 from 5.5 on admission  3)HTN- continue to HCTZ and lisinopril has been discontinued due to renal concerns,  may use IV Hydralazine 10 mg  Every 4 hours Prn for systolic blood pressure over 160 mmhg    4)Hyperglycemia- in the setting of recurrent pancreatitis, A1c is 5.7 , patient had episode of hypoglycemia due to poor intake overnight , advance diet to full liquid diet    5)Tobacco USE Disorder-Smoking cessation counseling offered, consider Nicotine replacement therapy   DVT prophylaxis:SCDs  Code Status:Full -   Family Communication:None present Disposition Plan:Home once clinically improved Consults  called:Surgery  Lab Results  Component Value Date   PLT 164 09/25/2016    Inpatient Medications  Scheduled Meds: . insulin aspart  0-15 Units Subcutaneous TID WC  . insulin aspart  0-5 Units Subcutaneous QHS  . nicotine  14 mg Transdermal Daily   Continuous Infusions: . lactated ringers 200 mL/hr at 09/24/16 2333   PRN Meds:.acetaminophen **OR** acetaminophen, hydrALAZINE, morphine injection, ondansetron **OR** ondansetron (ZOFRAN) IV    Anti-infectives    Start     Dose/Rate Route Frequency Ordered Stop   09/24/16 0400  piperacillin-tazobactam (ZOSYN) 2.25 g in dextrose 5 % 50 mL IVPB  Status:  Discontinued     2.25 g 100 mL/hr over 30 Minutes Intravenous Every 8 hours 09/23/16 1958 09/25/16 0806   09/23/16 2000  piperacillin-tazobactam (ZOSYN) IVPB 3.375 g     3.375 g 100 mL/hr over 30 Minutes Intravenous  Once 09/23/16 1958 09/23/16 2353   09/23/16 1722  piperacillin-tazobactam (ZOSYN) 3.375 GM/50ML IVPB  Status:  Discontinued    Comments:  Charmayne Sheer   : cabinet override      09/23/16 1722 09/23/16 1804        Objective:   Vitals:   09/24/16 2100 09/25/16 0500 09/25/16 1108 09/25/16 1515  BP: 125/69 108/67  119/78  Pulse: 82 77 82 89  Resp: 18 18  18   Temp: 98.1 F (36.7 C) 98.7  F (37.1 C)  98.5 F (36.9 C)  TempSrc: Oral Oral  Oral  SpO2: 98% 100% 97% 100%  Weight:      Height:        Wt Readings from Last 3 Encounters:  09/24/16 61.4 kg (135 lb 6.4 oz)  03/31/16 65 kg (143 lb 6.4 oz)  12/02/12 86.2 kg (190 lb)     Intake/Output Summary (Last 24 hours) at 09/25/16 1545 Last data filed at 09/25/16 1516  Gross per 24 hour  Intake             5040 ml  Output             1700 ml  Net             3340 ml     Physical Exam  Gen:- Awake Alert,  In no apparent distress  HEENT:- [place].AT, No sclera icterus Neck-Supple Neck,No JVD,.  Lungs-  CTAB  CV- S1, S2 normal Abd-  +ve B.Sounds, Abd Soft, resolving abdominal tenderness,     Extremity/Skin:- No  edema,       Data Review:   Micro Results No results found for this or any previous visit (from the past 240 hour(s)).  Radiology Reports Ct Abdomen Pelvis Wo Contrast  Result Date: 09/23/2016 CLINICAL DATA:  Abdominal pain EXAM: CT ABDOMEN AND PELVIS WITHOUT CONTRAST TECHNIQUE: Multidetector CT imaging of the abdomen and pelvis was performed following the standard protocol without IV contrast. COMPARISON:  None. FINDINGS: Lower chest: No pulmonary nodules. No visible pleural or pericardial effusion. Hepatobiliary: Normal hepatic size and contours. No perihepatic ascites. No intra- or extrahepatic biliary dilatation. Normal gallbladder. Pancreas: Normal pancreatic contours. No peripancreatic fluid collection or pancreatic ductal dilatation. Spleen: Normal. Adrenals/Urinary Tract: Normal adrenal glands. No hydronephrosis or nephrolithiasis. No abnormal perinephric stranding. No ureteral obstruction. Stomach/Bowel: There is inflammatory stranding and free fluid adjacent to the distal stomach and pancreatic head and the first and second portion of the duodenum. The appendix is normal. No small bowel obstruction. No focal colonic abnormality. Vascular/Lymphatic: There is atherosclerotic calcification of the non aneurysmal abdominal aorta. No abdominal or pelvic adenopathy. Reproductive: Prostate is upper limits normal. Musculoskeletal: No lytic or blastic osseous lesion. Normal visualized extrathoracic and extraperitoneal soft tissues. Other: No contributory non-categorized findings. IMPRESSION: 1. Right upper quadrant free fluid and inflammatory stranding, adjacent to the pancreatic head and proximal duodenum. This could indicate acute pancreatitis; however, duodenitis or a perforated gastric/duodenal ulcer might also cause this appearance. Correlation with laboratory values for pancreatitis is recommended. 2. Aortic atherosclerosis. Electronically Signed   By: Ulyses Jarred M.D.   On:  09/23/2016 16:38   US Abdomen Limited Ruq  Result Date: 09/24/2016 CLINICAL DATA:  Pancreatitis EXAM: US ABDOMEN LIMITED - RIGHT UPPER QUADRANT COMPARISON:  CT 09/23/2016 FINDINGS: Gallbladder: No gallstones or wall thickening visualized. No sonographic Murphy sign noted by sonographer. Common bile duct: Diameter: borderline diameter, 7 mm. Liver: Mildly increased echotexture.  No focal hepatic abnormality. IMPRESSION: Borderline common bile duct diameter, likely related to pancreatitis. Suspect early fatty infiltration of the liver. Electronically Signed   By: Rolm Baptise M.D.   On: 09/24/2016 08:30     CBC  Recent Labs Lab 09/23/16 1222 09/24/16 0423 09/25/16 0614  WBC 10.4 11.5* 9.1  HGB 12.9* 12.5* 11.1*  HCT 37.5* 36.0* 32.4*  PLT 208 192 164  MCV 92.4 91.8 93.1  MCH 31.8 31.9 31.9  MCHC 34.4 34.7 34.3  RDW 12.8 13.3 13.7  Chemistries   Recent Labs Lab 09/23/16 1222 09/24/16 0423 09/25/16 0614  NA 132* 136 141  K 5.3* 4.7 5.1  CL 102 108 108  CO2 18* 18* 23  GLUCOSE 150* 93 56*  BUN 132* 104* 57*  CREATININE 5.57* 3.26* 1.85*  CALCIUM 8.9 8.9 9.0  AST 15 45* 42*  ALT 10* 11* 11*  ALKPHOS 73 67 60  BILITOT 0.7 0.8 1.0   ------------------------------------------------------------------------------------------------------------------  Recent Labs  09/23/16 1227  TRIG 108    Lab Results  Component Value Date   HGBA1C 5.7 (H) 09/23/2016   ------------------------------------------------------------------------------------------------------------------ No results for input(s): TSH, T4TOTAL, T3FREE, THYROIDAB in the last 72 hours.  Invalid input(s): FREET3 ------------------------------------------------------------------------------------------------------------------ No results for input(s): VITAMINB12, FOLATE, FERRITIN, TIBC, IRON, RETICCTPCT in the last 72 hours.  Coagulation profile No results for input(s): INR, PROTIME in the last 168  hours.  No results for input(s): DDIMER in the last 72 hours.  Cardiac Enzymes No results for input(s): CKMB, TROPONINI, MYOGLOBIN in the last 168 hours.  Invalid input(s): CK ------------------------------------------------------------------------------------------------------------------ No results found for: BNP   Darlys Buis M.D on 09/25/2016 at 3:45 PM  Between 7am to 7pm - Pager - [phone]  After 7pm go to [email or web address] - password [email or web address]  [facility] -  Office  [phone]   Voice Recognition Viviann Spare dictation system was used to create this note, attempts have been made to correct errors. Please contact the author with questions and/or clarifications.

## 2016-09-25 NOTE — Progress Notes (Addendum)
Hypoglycemic Event  CBG: 69  Treatment: Orange juice   Symptoms: none   Follow-up CBG: Time:2200 CBG Result: 80  Possible Reasons for Event: Full Liquid diet.   Comments/MD notified: Dr. Denton Brick    Oddie Kuhlmann

## 2016-09-25 NOTE — Progress Notes (Addendum)
Subjective: Patient states his abdominal pain seems to have resolved. He is not having a significant need for IV pain medication. No nausea or vomiting have been noted.  Objective: Vital signs in last 24 hours: Temp:  [98.1 F (36.7 C)-98.7 F (37.1 C)] 98.7 F (37.1 C) (05/26 0500) Pulse Rate:  [77-82] 77 (05/26 0500) Resp:  [18] 18 (05/26 0500) BP: (108-125)/(67-75) 108/67 (05/26 0500) SpO2:  [97 %-100 %] 100 % (05/26 0500) Last BM Date: 09/22/16  Intake/Output from previous day: 05/25 0701 - 05/26 0700 In: 4440 [I.V.:4340; IV Piggyback:100] Out: 2100 [Urine:2100] Intake/Output this shift: No intake/output data recorded.  General appearance: alert, cooperative and no distress GI: soft, non-tender; bowel sounds normal; no masses,  no organomegaly  Lab Results:   Recent Labs  09/23/16 1222 09/24/16 0423  WBC 10.4 11.5*  HGB 12.9* 12.5*  HCT 37.5* 36.0*  PLT 208 192   BMET  Recent Labs  09/23/16 1222 09/24/16 0423  NA 132* 136  K 5.3* 4.7  CL 102 108  CO2 18* 18*  GLUCOSE 150* 93  BUN 132* 104*  CREATININE 5.57* 3.26*  CALCIUM 8.9 8.9   PT/INR No results for input(s): LABPROT, INR in the last 72 hours.  Studies/Results: Ct Abdomen Pelvis Wo Contrast  Result Date: 09/23/2016 CLINICAL DATA:  Abdominal pain EXAM: CT ABDOMEN AND PELVIS WITHOUT CONTRAST TECHNIQUE: Multidetector CT imaging of the abdomen and pelvis was performed following the standard protocol without IV contrast. COMPARISON:  None. FINDINGS: Lower chest: No pulmonary nodules. No visible pleural or pericardial effusion. Hepatobiliary: Normal hepatic size and contours. No perihepatic ascites. No intra- or extrahepatic biliary dilatation. Normal gallbladder. Pancreas: Normal pancreatic contours. No peripancreatic fluid collection or pancreatic ductal dilatation. Spleen: Normal. Adrenals/Urinary Tract: Normal adrenal glands. No hydronephrosis or nephrolithiasis. No abnormal perinephric stranding. No  ureteral obstruction. Stomach/Bowel: There is inflammatory stranding and free fluid adjacent to the distal stomach and pancreatic head and the first and second portion of the duodenum. The appendix is normal. No small bowel obstruction. No focal colonic abnormality. Vascular/Lymphatic: There is atherosclerotic calcification of the non aneurysmal abdominal aorta. No abdominal or pelvic adenopathy. Reproductive: Prostate is upper limits normal. Musculoskeletal: No lytic or blastic osseous lesion. Normal visualized extrathoracic and extraperitoneal soft tissues. Other: No contributory non-categorized findings. IMPRESSION: 1. Right upper quadrant free fluid and inflammatory stranding, adjacent to the pancreatic head and proximal duodenum. This could indicate acute pancreatitis; however, duodenitis or a perforated gastric/duodenal ulcer might also cause this appearance. Correlation with laboratory values for pancreatitis is recommended. 2. Aortic atherosclerosis. Electronically Signed   By: Ulyses Jarred M.D.   On: 09/23/2016 16:38   US Abdomen Limited Ruq  Result Date: 09/24/2016 CLINICAL DATA:  Pancreatitis EXAM: US ABDOMEN LIMITED - RIGHT UPPER QUADRANT COMPARISON:  CT 09/23/2016 FINDINGS: Gallbladder: No gallstones or wall thickening visualized. No sonographic Murphy sign noted by sonographer. Common bile duct: Diameter: borderline diameter, 7 mm. Liver: Mildly increased echotexture.  No focal hepatic abnormality. IMPRESSION: Borderline common bile duct diameter, likely related to pancreatitis. Suspect early fatty infiltration of the liver. Electronically Signed   By: Rolm Baptise M.D.   On: 09/24/2016 08:30    Anti-infectives: Anti-infectives    Start     Dose/Rate Route Frequency Ordered Stop   09/24/16 0400  piperacillin-tazobactam (ZOSYN) 2.25 g in dextrose 5 % 50 mL IVPB     2.25 g 100 mL/hr over 30 Minutes Intravenous Every 8 hours 09/23/16 1958     09/23/16 2000  piperacillin-tazobactam (ZOSYN)  IVPB 3.375 g     3.375 g 100 mL/hr over 30 Minutes Intravenous  Once 09/23/16 1958 09/23/16 2353   09/23/16 1722  piperacillin-tazobactam (ZOSYN) 3.375 GM/50ML IVPB  Status:  Discontinued    Comments:  Charmayne Sheer   : cabinet override      09/23/16 1722 09/23/16 1804      Assessment/Plan: Impression: Pancreatitis of unknown etiology, probable peptic ulcer disease. H pylori antibody pending. Ultrasound of gallbladder negative. Plan: May start clear liquid diet. No need for acute surgical intervention at this time. Will stop Zosyn.  LOS: 2 days    Aviva Signs 09/25/2016

## 2016-09-26 LAB — BASIC METABOLIC PANEL
ANION GAP: 7 (ref 5–15)
BUN: 29 mg/dL — AB (ref 6–20)
CHLORIDE: 103 mmol/L (ref 101–111)
CO2: 27 mmol/L (ref 22–32)
Calcium: 8.4 mg/dL — ABNORMAL LOW (ref 8.9–10.3)
Creatinine, Ser: 1.4 mg/dL — ABNORMAL HIGH (ref 0.61–1.24)
GFR calc Af Amer: >60 mL/min (ref >60)
GFR, EST NON AFRICAN AMERICAN: 54 mL/min — AB (ref >60)
GLUCOSE: 100 mg/dL — AB (ref 65–99)
POTASSIUM: 4.4 mmol/L (ref 3.5–5.1)
Sodium: 137 mmol/L (ref 135–145)

## 2016-09-26 LAB — GLUCOSE, CAPILLARY: Glucose-Capillary: 115 mg/dL — ABNORMAL HIGH (ref 65–99)

## 2016-09-26 MED ORDER — ONDANSETRON HCL 4 MG PO TABS: 4.0000 mg | ORAL_TABLET | Freq: Four times a day (QID) | ORAL | 0 refills | Status: DC | PRN

## 2016-09-26 MED ORDER — NICOTINE 14 MG/24HR TD PT24: 14.0000 mg | MEDICATED_PATCH | Freq: Every day | TRANSDERMAL | 0 refills | Status: DC

## 2016-09-26 MED ORDER — PANTOPRAZOLE SODIUM 40 MG PO TBEC: 40.0000 mg | DELAYED_RELEASE_TABLET | Freq: Every day | ORAL | 1 refills | Status: DC

## 2016-09-26 NOTE — Progress Notes (Signed)
Patient IV removed, tolerated well. Discharge instructions given at bedside.  

## 2016-09-26 NOTE — Discharge Instructions (Signed)
Continue to abstain from alcohol Take medications as prescribed Drink plenty fluids Avoid greasy and spicy meals Avoid ibuprofen/Advil/Aleve/Motrin/Goody Powders/Naproxen/BC powders as these will make you more likely to bleed and can cause stomach ulcers

## 2016-09-26 NOTE — Progress Notes (Signed)
  Subjective: No abdominal pain noted. Tolerated full liquid diet well today.  Objective: Vital signs in last 24 hours: Temp:  [98.3 F (36.8 C)-98.8 F (37.1 C)] 98.3 F (36.8 C) (05/27 0500) Pulse Rate:  [82-91] 91 (05/27 0500) Resp:  [18] 18 (05/27 0500) BP: (115-119)/(74-78) 115/74 (05/27 0500) SpO2:  [97 %-100 %] 99 % (05/27 0500) Weight:  [135 lb 2.3 oz (61.3 kg)] 135 lb 2.3 oz (61.3 kg) (05/27 0500) Last BM Date: 09/22/16  Intake/Output from previous day: 05/26 0701 - 05/27 0700 In: 960 [P.O.:960] Out: 1500 [Urine:1500] Intake/Output this shift: Total I/O In: 240 [P.O.:240] Out: -   General appearance: alert, cooperative and no distress GI: soft, non-tender; bowel sounds normal; no masses,  no organomegaly  Lab Results:   Recent Labs  09/24/16 0423 09/25/16 0614  WBC 11.5* 9.1  HGB 12.5* 11.1*  HCT 36.0* 32.4*  PLT 192 164   BMET  Recent Labs  09/25/16 0614 09/26/16 0617  NA 141 137  K 5.1 4.4  CL 108 103  CO2 23 27  GLUCOSE 56* 100*  BUN 57* 29*  CREATININE 1.85* 1.40*  CALCIUM 9.0 8.4*   PT/INR No results for input(s): LABPROT, INR in the last 72 hours.  Studies/Results: No results found.  Anti-infectives: Anti-infectives    Start     Dose/Rate Route Frequency Ordered Stop   09/24/16 0400  piperacillin-tazobactam (ZOSYN) 2.25 g in dextrose 5 % 50 mL IVPB  Status:  Discontinued     2.25 g 100 mL/hr over 30 Minutes Intravenous Every 8 hours 09/23/16 1958 09/25/16 0806   09/23/16 2000  piperacillin-tazobactam (ZOSYN) IVPB 3.375 g     3.375 g 100 mL/hr over 30 Minutes Intravenous  Once 09/23/16 1958 09/23/16 2353   09/23/16 1722  piperacillin-tazobactam (ZOSYN) 3.375 GM/50ML IVPB  Status:  Discontinued    Comments:  Charmayne Sheer   : cabinet override      09/23/16 1722 09/23/16 1804      Assessment/Plan: Impression: Pancreatitis of unknown etiology. Probable peptic ulcer disease. Plan: No need for acute surgical intervention. Okay  for discharge from surgery standpoint.  LOS: 3 days    Aviva Signs 09/26/2016

## 2016-09-26 NOTE — Discharge Summary (Signed)
John Carroll, is a 56 y.o. male  DOB [birth year]  MRN [id number].  Admission date:  09/23/2016  Admitting Physician  Karmen Bongo, MD  Discharge Date:  09/26/2016   Primary MD  Patient, No Pcp Per  Recommendations for primary care physician for things to follow:   Consider restarting Lisinopril/HCTZ if BP becomes elevated and his renal function and electrolytes are normal   Admission Diagnosis  Abdominal pain [R10.9] Idiopathic acute pancreatitis, unspecified complication status [O70.96]   Discharge Diagnosis  Abdominal pain [R10.9] Idiopathic acute pancreatitis, unspecified complication status [G83.66]    Principal Problem:   Pancreatitis Active Problems:   Acute renal failure (ARF) ([place])   Hypertension   Marijuana abuse   Tobacco abuse   Hyperglycemia      Past Medical History:  Diagnosis Date  . Chronic back pain   . Epididymitis   . Hypertension     History reviewed. No pertinent surgical history.     HPI  from the history and physical done on the day of admission:     HPI: John Carroll is a 56 y.o. male with medical history significant of HTN presenting with abdominal pain.  He reports that about 1 year ago, he had his teeth pulled.  He got sick and was told he had malnutrition.  He came to [facility] and reports that he was hospitalized about 4-5 days (see below for details from Epic).  He decided he wasn't going to allow himself to get in that position again, even though he can only afford certain foods.  His mother had brain surgery a couple of months ago and he spent about 1 1/2 months in the hospital with her.  "I just kinda let myself go."  When he tried to eat, he couldn't.  He "saw that it was getting out of hand and here's where I'm at".  Food didn't taste good and he would try to eat it but he would throw it up.  In the last 2 weeks, he really wasn't able to eat, only  drinking chocolate Ensure.  He tried to eat but just could not eat.  He thinks he has lost 50 pounds since last hospitalization (although he weight is down 8 pounds according to today's weight).  2 days ago he developed severe LUQ abdominal pain.  Takes BP meds and One-A-Day but denies taking other OTC medications.  He was taking Flexeril but stopped that.  Still has a gallbladder (no h/o surgery).  Quit alcohol about 7 years ago, h/o heavy use.  Went to rehab to quit, "took me 3 times but I did it".  He does acknowledge intermittent marijuana use.  Last cholesterol was about 4 months ago, he was told it was okay.    He was hospitalized from 11/16-29/17.  His primary diagnoses with AKI with hyperkalemia and metabolic acidosis that resolved with volume repletion.  His Lisinopril/HCTZ was held at the time of discharge but it does appear to have been resumed.  His creatinine on admission  was 6.60 and it was down to 1/24 at the time of discharge; he did have a normal renal US during that hospitalization.  ED Course:  CT concerning for perforated ulcer with pancreatitis; Dr. Arnoldo Morale consulted and he reportedly said that surgery wouldn't be indicated without evidence of free air.  Given IVF and planned for admission.    Hospital Course:    1)Acute Pancreatitis- CT abdomen and ultrasound suggesting pancreatitis, Clinically Much improved, eating and drinking well, had a bowel movement, no further nausea, no vomiting, no fevers, lipase is down to 416 from 2197, treated with IV fluids and prn Zofran , triglycerides are not elevated, no evidence of gallstones on abdominal imaging .Patient denies ongoing alcohol use. Surgical consult appreciated. Clinically and radiologically perforated viscus thought to be less likely. Suspect PUD, will D/c Home on protonix  2)Acute renal failure- suspect secondary to prerenal azotemia in the setting of severe dehydration, creatinine is down to 1.4 from 5.5 on  admission  3)HTN- Lisinopril/HCTZ remains on hold due to dehydration and kidney concerns, BP is stable without rx  4)Hyperglycemia- in the setting of recurrent pancreatitis, A1c is 5.7 ,    5)Tobacco USE Disorder-Smoking cessation counseling offered, use Nicotine replacement therapy   Discharge Condition: stable  Follow UP-pcp    Consults obtained - Surgery  Diet and Activity recommendation:  As advised  Discharge Instructions    Continue to abstain from alcohol Take medications as prescribed Drink plenty fluids Avoid greasy and spicy meals Avoid ibuprofen/Advil/Aleve/Motrin/Goody Powders/Naproxen/BC powders as these will make you more likely to bleed and can cause stomach ulcers Quit smoking Discharge Instructions    Call MD for:  difficulty breathing, headache or visual disturbances    Complete by:  As directed    Call MD for:  persistant dizziness or light-headedness    Complete by:  As directed    Call MD for:  persistant nausea and vomiting    Complete by:  As directed    Call MD for:  severe uncontrolled pain    Complete by:  As directed    Call MD for:  temperature >100.4    Complete by:  As directed    Diet - low sodium heart healthy    Complete by:  As directed    Discharge instructions    Complete by:  As directed    Continue to abstain from alcohol Take medications as prescribed Drink plenty fluids Avoid greasy and spicy meals Avoid ibuprofen/Advil/Aleve/Motrin/Goody Powders/Naproxen/BC powders as these will make you more likely to bleed and can cause stomach ulcers Quit smoking   Increase activity slowly    Complete by:  As directed         Discharge Medications     Allergies as of 09/26/2016   No Known Allergies     Medication List    TAKE these medications   multivitamin with minerals Tabs tablet Take 1 tablet by mouth daily.   nicotine 14 mg/24hr patch Commonly known as:  NICODERM CQ - dosed in mg/24 hours Place 1 patch (14 mg total)  onto the skin daily.   ondansetron 4 MG tablet Commonly known as:  ZOFRAN Take 1 tablet (4 mg total) by mouth every 6 (six) hours as needed for nausea.   pantoprazole 40 MG tablet Commonly known as:  PROTONIX Take 1 tablet (40 mg total) by mouth daily.       Major procedures and Radiology Reports - PLEASE review detailed and final reports for all details, in  brief -     Ct Abdomen Pelvis Wo Contrast  Result Date: 09/23/2016 CLINICAL DATA:  Abdominal pain EXAM: CT ABDOMEN AND PELVIS WITHOUT CONTRAST TECHNIQUE: Multidetector CT imaging of the abdomen and pelvis was performed following the standard protocol without IV contrast. COMPARISON:  None. FINDINGS: Lower chest: No pulmonary nodules. No visible pleural or pericardial effusion. Hepatobiliary: Normal hepatic size and contours. No perihepatic ascites. No intra- or extrahepatic biliary dilatation. Normal gallbladder. Pancreas: Normal pancreatic contours. No peripancreatic fluid collection or pancreatic ductal dilatation. Spleen: Normal. Adrenals/Urinary Tract: Normal adrenal glands. No hydronephrosis or nephrolithiasis. No abnormal perinephric stranding. No ureteral obstruction. Stomach/Bowel: There is inflammatory stranding and free fluid adjacent to the distal stomach and pancreatic head and the first and second portion of the duodenum. The appendix is normal. No small bowel obstruction. No focal colonic abnormality. Vascular/Lymphatic: There is atherosclerotic calcification of the non aneurysmal abdominal aorta. No abdominal or pelvic adenopathy. Reproductive: Prostate is upper limits normal. Musculoskeletal: No lytic or blastic osseous lesion. Normal visualized extrathoracic and extraperitoneal soft tissues. Other: No contributory non-categorized findings. IMPRESSION: 1. Right upper quadrant free fluid and inflammatory stranding, adjacent to the pancreatic head and proximal duodenum. This could indicate acute pancreatitis; however, duodenitis  or a perforated gastric/duodenal ulcer might also cause this appearance. Correlation with laboratory values for pancreatitis is recommended. 2. Aortic atherosclerosis. Electronically Signed   By: Ulyses Jarred M.D.   On: 09/23/2016 16:38   US Abdomen Limited Ruq  Result Date: 09/24/2016 CLINICAL DATA:  Pancreatitis EXAM: US ABDOMEN LIMITED - RIGHT UPPER QUADRANT COMPARISON:  CT 09/23/2016 FINDINGS: Gallbladder: No gallstones or wall thickening visualized. No sonographic Murphy sign noted by sonographer. Common bile duct: Diameter: borderline diameter, 7 mm. Liver: Mildly increased echotexture.  No focal hepatic abnormality. IMPRESSION: Borderline common bile duct diameter, likely related to pancreatitis. Suspect early fatty infiltration of the liver. Electronically Signed   By: Rolm Baptise M.D.   On: 09/24/2016 08:30    Micro Results   No results found for this or any previous visit (from the past 240 hour(s)).     Today   Subjective    John Carroll today has no new complaints, eating and drinking well, no fevers, no emesis, had soft BM          Patient has been seen and examined prior to discharge   Objective   Blood pressure 115/74, pulse 91, temperature 98.3 F (36.8 C), temperature source Oral, resp. rate 18, height 5\' 7"  (1.702 m), weight 61.3 kg (135 lb 2.3 oz), SpO2 99 %.   Intake/Output Summary (Last 24 hours) at 09/26/16 1044 Last data filed at 09/26/16 0900  Gross per 24 hour  Intake              960 ml  Output              850 ml  Net              110 ml    Exam Gen:- Awake  In no apparent distress  HEENT:- [place].AT,   Neck-Supple Neck,No JVD,  Lungs- mostly clear  CV- S1, S2 normal Abd-  +ve B.Sounds, Abd Soft, No tenderness,    Extremity/Skin:- Intact peripheral pulses     Data Review   CBC w Diff: Lab Results  Component Value Date   WBC 9.1 09/25/2016   HGB 11.1 (L) 09/25/2016   HCT 32.4 (L) 09/25/2016   PLT 164 09/25/2016   LYMPHOPCT 27 03/29/2016  MONOPCT 9 03/29/2016   EOSPCT 0 03/29/2016   BASOPCT 0 03/29/2016    CMP: Lab Results  Component Value Date   NA 137 09/26/2016   K 4.4 09/26/2016   CL 103 09/26/2016   CO2 27 09/26/2016   BUN 29 (H) 09/26/2016   CREATININE 1.40 (H) 09/26/2016   PROT 5.8 (L) 09/25/2016   ALBUMIN 3.0 (L) 09/25/2016   BILITOT 1.0 09/25/2016   ALKPHOS 60 09/25/2016   AST 42 (H) 09/25/2016   ALT 11 (L) 09/25/2016  .   Total Discharge time is about 33 minutes  Renaud Celli M.D on 09/26/2016 at 10:44 AM  [facility]   Office  [phone]  Voice Recognition Viviann Spare dictation system was used to create this note, attempts have been made to correct errors. Please contact the author with questions and/or clarifications.

## 2016-09-28 LAB — H. PYLORI ANTIBODY, IGG: H Pylori IgG: 9 Index Value — ABNORMAL HIGH (ref 0.00–0.79)

## 2017-03-23 ENCOUNTER — Emergency Department (HOSPITAL_COMMUNITY): Payer: Medicaid Other

## 2017-03-23 ENCOUNTER — Other Ambulatory Visit: Payer: Self-pay

## 2017-03-23 ENCOUNTER — Inpatient Hospital Stay (HOSPITAL_COMMUNITY)
Admission: EM | Admit: 2017-03-23 | Discharge: 2017-03-27 | DRG: 683 | Disposition: A | Discharge status: Home / Self Care | Payer: Medicaid Other | Attending: Internal Medicine | Admitting: Internal Medicine

## 2017-03-23 ENCOUNTER — Encounter (HOSPITAL_COMMUNITY): Payer: Self-pay | Admitting: Emergency Medicine

## 2017-03-23 DIAGNOSIS — F1310 Sedative, hypnotic or anxiolytic abuse, uncomplicated: Secondary | ICD-10-CM | POA: Diagnosis present

## 2017-03-23 DIAGNOSIS — R634 Abnormal weight loss: Secondary | ICD-10-CM | POA: Diagnosis present

## 2017-03-23 DIAGNOSIS — E870 Hyperosmolality and hypernatremia: Secondary | ICD-10-CM | POA: Diagnosis not present

## 2017-03-23 DIAGNOSIS — F17210 Nicotine dependence, cigarettes, uncomplicated: Secondary | ICD-10-CM | POA: Diagnosis present

## 2017-03-23 DIAGNOSIS — I10 Essential (primary) hypertension: Secondary | ICD-10-CM

## 2017-03-23 DIAGNOSIS — I959 Hypotension, unspecified: Secondary | ICD-10-CM | POA: Diagnosis not present

## 2017-03-23 DIAGNOSIS — Z841 Family history of disorders of kidney and ureter: Secondary | ICD-10-CM

## 2017-03-23 DIAGNOSIS — N178 Other acute kidney failure: Secondary | ICD-10-CM | POA: Diagnosis not present

## 2017-03-23 DIAGNOSIS — E875 Hyperkalemia: Secondary | ICD-10-CM | POA: Diagnosis present

## 2017-03-23 DIAGNOSIS — D649 Anemia, unspecified: Secondary | ICD-10-CM | POA: Diagnosis present

## 2017-03-23 DIAGNOSIS — E871 Hypo-osmolality and hyponatremia: Secondary | ICD-10-CM | POA: Diagnosis present

## 2017-03-23 DIAGNOSIS — E861 Hypovolemia: Secondary | ICD-10-CM | POA: Diagnosis present

## 2017-03-23 DIAGNOSIS — R627 Adult failure to thrive: Secondary | ICD-10-CM | POA: Diagnosis present

## 2017-03-23 DIAGNOSIS — I9589 Other hypotension: Secondary | ICD-10-CM

## 2017-03-23 DIAGNOSIS — N179 Acute kidney failure, unspecified: Secondary | ICD-10-CM

## 2017-03-23 DIAGNOSIS — F1210 Cannabis abuse, uncomplicated: Secondary | ICD-10-CM | POA: Diagnosis present

## 2017-03-23 DIAGNOSIS — R739 Hyperglycemia, unspecified: Secondary | ICD-10-CM | POA: Diagnosis present

## 2017-03-23 DIAGNOSIS — E860 Dehydration: Secondary | ICD-10-CM | POA: Diagnosis present

## 2017-03-23 DIAGNOSIS — Z8249 Family history of ischemic heart disease and other diseases of the circulatory system: Secondary | ICD-10-CM | POA: Diagnosis not present

## 2017-03-23 DIAGNOSIS — R571 Hypovolemic shock: Secondary | ICD-10-CM | POA: Diagnosis not present

## 2017-03-23 DIAGNOSIS — R633 Feeding difficulties: Secondary | ICD-10-CM | POA: Diagnosis present

## 2017-03-23 DIAGNOSIS — Z681 Body mass index (BMI) 19 or less, adult: Secondary | ICD-10-CM

## 2017-03-23 DIAGNOSIS — Z720 Tobacco use: Secondary | ICD-10-CM | POA: Diagnosis present

## 2017-03-23 DIAGNOSIS — Z833 Family history of diabetes mellitus: Secondary | ICD-10-CM | POA: Diagnosis not present

## 2017-03-23 DIAGNOSIS — R630 Anorexia: Secondary | ICD-10-CM | POA: Diagnosis present

## 2017-03-23 HISTORY — DX: Acute kidney failure, unspecified: N17.9

## 2017-03-23 HISTORY — DX: Acute pancreatitis without necrosis or infection, unspecified: K85.90

## 2017-03-23 LAB — BASIC METABOLIC PANEL
ANION GAP: 14 (ref 5–15)
Anion gap: 10 (ref 5–15)
BUN: 221 mg/dL — AB (ref 6–20)
BUN: 188 mg/dL — AB (ref 6–20)
CALCIUM: 9.7 mg/dL (ref 8.9–10.3)
CALCIUM: 8.7 mg/dL — AB (ref 8.9–10.3)
CO2: 16 mmol/L — ABNORMAL LOW (ref 22–32)
CO2: 17 mmol/L — ABNORMAL LOW (ref 22–32)
CREATININE: 4.93 mg/dL — AB (ref 0.61–1.24)
Chloride: 102 mmol/L (ref 101–111)
Chloride: 107 mmol/L (ref 101–111)
Creatinine, Ser: 6.83 mg/dL — ABNORMAL HIGH (ref 0.61–1.24)
GFR calc Af Amer: 9 mL/min — ABNORMAL LOW (ref >60)
GFR calc Af Amer: 14 mL/min — ABNORMAL LOW (ref >60)
GFR, EST NON AFRICAN AMERICAN: 8 mL/min — AB (ref >60)
GFR, EST NON AFRICAN AMERICAN: 12 mL/min — AB (ref >60)
GLUCOSE: 111 mg/dL — AB (ref 65–99)
Glucose, Bld: 110 mg/dL — ABNORMAL HIGH (ref 65–99)
Potassium: 5.9 mmol/L — ABNORMAL HIGH (ref 3.5–5.1)
Potassium: 5.4 mmol/L — ABNORMAL HIGH (ref 3.5–5.1)
SODIUM: 134 mmol/L — AB (ref 135–145)
Sodium: 132 mmol/L — ABNORMAL LOW (ref 135–145)

## 2017-03-23 LAB — URINALYSIS, ROUTINE W REFLEX MICROSCOPIC
BILIRUBIN URINE: NEGATIVE
Glucose, UA: NEGATIVE mg/dL
Hgb urine dipstick: NEGATIVE
KETONES UR: NEGATIVE mg/dL
Leukocytes, UA: NEGATIVE
NITRITE: NEGATIVE
PROTEIN: NEGATIVE mg/dL
Specific Gravity, Urine: 1.013 (ref 1.005–1.030)
pH: 5 (ref 5.0–8.0)

## 2017-03-23 LAB — CBC
HCT: 32.7 % — ABNORMAL LOW (ref 39.0–52.0)
Hemoglobin: 10.5 g/dL — ABNORMAL LOW (ref 13.0–17.0)
MCH: 30.7 pg (ref 26.0–34.0)
MCHC: 32.1 g/dL (ref 30.0–36.0)
MCV: 95.6 fL (ref 78.0–100.0)
Platelets: 211 10*3/uL (ref 150–400)
RBC: 3.42 MIL/uL — ABNORMAL LOW (ref 4.22–5.81)
RDW: 13.7 % (ref 11.5–15.5)
WBC: 7.7 10*3/uL (ref 4.0–10.5)

## 2017-03-23 LAB — HEPATIC FUNCTION PANEL
ALK PHOS: 61 U/L (ref 38–126)
ALT: 12 U/L — ABNORMAL LOW (ref 17–63)
AST: 15 U/L (ref 15–41)
Albumin: 4.4 g/dL (ref 3.5–5.0)
Bilirubin, Direct: <0.1 mg/dL — ABNORMAL LOW (ref 0.1–0.5)
TOTAL PROTEIN: 7.6 g/dL (ref 6.5–8.1)
Total Bilirubin: 0.9 mg/dL (ref 0.3–1.2)

## 2017-03-23 LAB — RAPID URINE DRUG SCREEN, HOSP PERFORMED
Amphetamines: NOT DETECTED
Barbiturates: NOT DETECTED
Benzodiazepines: POSITIVE — AB
COCAINE: NOT DETECTED
OPIATES: NOT DETECTED
Tetrahydrocannabinol: POSITIVE — AB

## 2017-03-23 LAB — I-STAT CG4 LACTIC ACID, ED: Lactic Acid, Venous: 0.36 mmol/L — ABNORMAL LOW (ref 0.5–1.9)

## 2017-03-23 LAB — LIPASE, BLOOD: LIPASE: 148 U/L — AB (ref 11–51)

## 2017-03-23 LAB — CBG MONITORING, ED: GLUCOSE-CAPILLARY: 103 mg/dL — AB (ref 65–99)

## 2017-03-23 MED ORDER — SODIUM CHLORIDE 0.9 % IV BOLUS (SEPSIS)
1000.0000 mL | Freq: Once | INTRAVENOUS | Status: AC
  Administered 2017-03-23: 1000 mL via INTRAVENOUS

## 2017-03-23 MED ORDER — DOCUSATE SODIUM 100 MG PO CAPS
100.0000 mg | ORAL_CAPSULE | Freq: Two times a day (BID) | ORAL | Status: DC
  Administered 2017-03-24 – 2017-03-26 (×4): 100 mg via ORAL
  Filled 2017-03-23 (×7): qty 1

## 2017-03-23 MED ORDER — ACETAMINOPHEN 325 MG PO TABS: 650.0000 mg | ORAL_TABLET | Freq: Four times a day (QID) | ORAL | Status: DC | PRN

## 2017-03-23 MED ORDER — NICOTINE 14 MG/24HR TD PT24
14.0000 mg | MEDICATED_PATCH | Freq: Every day | TRANSDERMAL | Status: DC
  Administered 2017-03-23 – 2017-03-27 (×5): 14 mg via TRANSDERMAL
  Filled 2017-03-23 (×5): qty 1

## 2017-03-23 MED ORDER — SODIUM CHLORIDE 0.9 % IV SOLN
1.0000 g | Freq: Once | INTRAVENOUS | Status: AC
  Administered 2017-03-23: 1 g via INTRAVENOUS
  Filled 2017-03-23 (×2): qty 10

## 2017-03-23 MED ORDER — ONDANSETRON HCL 4 MG PO TABS: 4.0000 mg | ORAL_TABLET | Freq: Four times a day (QID) | ORAL | Status: DC | PRN

## 2017-03-23 MED ORDER — ENOXAPARIN SODIUM 30 MG/0.3ML ~~LOC~~ SOLN
30.0000 mg | SUBCUTANEOUS | Status: DC
  Administered 2017-03-23 – 2017-03-26 (×4): 30 mg via SUBCUTANEOUS
  Filled 2017-03-23 (×4): qty 0.3

## 2017-03-23 MED ORDER — ONDANSETRON HCL 4 MG/2ML IJ SOLN: 4.0000 mg | Freq: Four times a day (QID) | INTRAMUSCULAR | Status: DC | PRN

## 2017-03-23 MED ORDER — ACETAMINOPHEN 650 MG RE SUPP: 650.0000 mg | Freq: Four times a day (QID) | RECTAL | Status: DC | PRN

## 2017-03-23 MED ORDER — SODIUM CHLORIDE 0.9% FLUSH
3.0000 mL | Freq: Two times a day (BID) | INTRAVENOUS | Status: DC
  Administered 2017-03-23 – 2017-03-27 (×2): 3 mL via INTRAVENOUS

## 2017-03-23 MED ORDER — IOPAMIDOL (ISOVUE-300) INJECTION 61%
INTRAVENOUS | Status: AC
  Filled 2017-03-23: qty 30

## 2017-03-23 MED ORDER — LACTATED RINGERS IV SOLN
INTRAVENOUS | Status: DC
  Administered 2017-03-23 – 2017-03-24 (×2): via INTRAVENOUS

## 2017-03-23 NOTE — ED Provider Notes (Signed)
[facility] EMERGENCY DEPARTMENT Provider Note   CSN: [id number] Arrival date & time: 03/23/17  1208     History   Chief Complaint No chief complaint on file.   HPI John Carroll is a 56 y.o. male.  HPI Patient presents with several months of generalized weakness and fatigue.  States he is lost roughly 50 pounds of the last 6 months.  He said difficulty eating and drinking since he had most of his teeth extracted last year.  He has been able to tolerate boost.  States he frequently gets nauseated and vomits.  He denies any abdominal pain.  No shortness of breath or chest pain. Past Medical History:  Diagnosis Date  . Acute renal failure (ARF) ([place]) 5/18, 11/18  . Chronic back pain   . Epididymitis   . Hypertension    stopped medications due to low BPs  . Pancreatitis 08/2016    Patient Active Problem List   Diagnosis Date Noted  . Pancreatitis 09/23/2016  . Marijuana abuse 09/23/2016  . Tobacco abuse 09/23/2016  . Hyperglycemia 09/23/2016  . AKI (acute kidney injury) ([place]) 03/29/2016  . Acute renal failure (ARF) ([place]) 03/28/2016  . Hypertension 03/28/2016  . Hyponatremia 03/28/2016    History reviewed. No pertinent surgical history.     Home Medications    Prior to Admission medications   Medication Sig Start Date End Date Taking? Authorizing Provider  lisinopril-hydrochlorothiazide (PRINZIDE,ZESTORETIC) 20-12.5 MG tablet Take 1 tablet by mouth daily. 02/12/17  Yes [provider]  Multiple Vitamin (MULTIVITAMIN WITH MINERALS) TABS tablet Take 1 tablet by mouth daily.   Yes [provider]    Family History Family History  Problem Relation Age of Onset  . Kidney disease Mother   . Hypertension Mother   . Diabetes Mellitus II Mother   . Heart attack Father 16       Died of MI in 45  . Heart attack Sister   . Hypertension Sister     Social History Social History   Tobacco Use  . Smoking status: Current Every Day Smoker   Packs/day: 1.00    Years: 27.00    Pack years: 27.00    Types: Cigarettes  . Smokeless tobacco: Never Used  Substance Use Topics  . Alcohol use: No    Comment: quit after rehab  . Drug use: Yes    Types: Marijuana    Comment: occasional use, last use maybe a couple of weeks ago     Allergies   Patient has no known allergies.   Review of Systems Review of Systems  Constitutional: Positive for activity change, appetite change and fatigue. Negative for chills and fever.  HENT: Negative for congestion and trouble swallowing.   Eyes: Negative for visual disturbance.  Respiratory: Negative for cough and shortness of breath.   Cardiovascular: Negative for chest pain, palpitations and leg swelling.  Gastrointestinal: Positive for nausea and vomiting. Negative for abdominal pain, blood in stool, constipation and diarrhea.  Genitourinary: Negative for flank pain, frequency and hematuria.  Musculoskeletal: Negative for back pain, myalgias, neck pain and neck stiffness.  Skin: Negative for rash and wound.  Neurological: Positive for light-headedness. Negative for dizziness, syncope, weakness, numbness and headaches.  All other systems reviewed and are negative.    Physical Exam Updated Vital Signs BP 96/64   Pulse 86   Temp 98 F (36.7 C)   Resp 10   Ht 5\' 8"  (1.727 m)   Wt 49.9 kg (110 lb)  SpO2 99%   BMI 16.73 kg/m   Physical Exam  Constitutional: He is oriented to person, place, and time. He appears well-developed. No distress.  Cachectic appearing  HENT:  Head: Normocephalic and atraumatic.  Mouth/Throat: Oropharynx is clear and moist. No oropharyngeal exudate.  Largely edentulous  Eyes: EOM are normal. Pupils are equal, round, and reactive to light.  Neck: Normal range of motion. Neck supple.  Cardiovascular: Normal rate and regular rhythm. Exam reveals no gallop and no friction rub.  No murmur heard. Pulmonary/Chest: Effort normal.  Patient has some rhonchi in  bilateral bases.  Abdominal: Soft. Bowel sounds are normal. There is no tenderness. There is no rebound and no guarding.  Musculoskeletal: Normal range of motion. He exhibits no edema or tenderness.  No lower extremity swelling, asymmetry or tenderness.  No midline thoracic or lumbar tenderness.  No CVA tenderness.  Lymphadenopathy:    He has no cervical adenopathy.  Neurological: He is alert and oriented to person, place, and time.  Moves all extremities without focal deficit.  Sensation intact.  Patient does have slight tremor.  Skin: Skin is warm and dry. Capillary refill takes less than 2 seconds. No rash noted. He is not diaphoretic. No erythema.  Psychiatric: He has a normal mood and affect. His behavior is normal.  Nursing note and vitals reviewed.    ED Treatments / Results  Labs (all labs ordered are listed, but only abnormal results are displayed) Labs Reviewed  BASIC METABOLIC PANEL - Abnormal; Notable for the following components:      Result Value   Sodium 132 (*)    Potassium 5.9 (*)    CO2 16 (*)    Glucose, Bld 111 (*)    BUN 221 (*)    Creatinine, Ser 6.83 (*)    GFR calc non Af Amer 8 (*)    GFR calc Af Amer 9 (*)    All other components within normal limits  CBC - Abnormal; Notable for the following components:   RBC 3.42 (*)    Hemoglobin 10.5 (*)    HCT 32.7 (*)    All other components within normal limits  HEPATIC FUNCTION PANEL - Abnormal; Notable for the following components:   ALT 12 (*)    Bilirubin, Direct <0.1 (*)    All other components within normal limits  LIPASE, BLOOD - Abnormal; Notable for the following components:   Lipase 148 (*)    All other components within normal limits  RAPID URINE DRUG SCREEN, HOSP PERFORMED - Abnormal; Notable for the following components:   Benzodiazepines POSITIVE (*)    Tetrahydrocannabinol POSITIVE (*)    All other components within normal limits  CBG MONITORING, ED - Abnormal; Notable for the following  components:   Glucose-Capillary 103 (*)    All other components within normal limits  I-STAT CG4 LACTIC ACID, ED - Abnormal; Notable for the following components:   Lactic Acid, Venous 0.36 (*)    All other components within normal limits  URINALYSIS, ROUTINE W REFLEX MICROSCOPIC    EKG  EKG Interpretation  Date/Time:  Wednesday March 23 2017 12:32:22 EST Ventricular Rate:  96 PR Interval:    QRS Duration: 81 QT Interval:  334 QTC Calculation: 422 R Axis:   45 Text Interpretation:  Sinus tachycardia Low voltage, extremity and precordial leads When compared with ECG of 03/28/2016, No significant change was found Confirmed by Delora Fuel ([id number]) on 03/23/2017 3:00:54 PM       Radiology Ct  Abdomen Pelvis Wo Contrast  Result Date: 03/23/2017 CLINICAL DATA:  Unspecified abdominal pain. EXAM: CT ABDOMEN AND PELVIS WITHOUT CONTRAST TECHNIQUE: Multidetector CT imaging of the abdomen and pelvis was performed following the standard protocol without IV contrast. COMPARISON:  09/23/2016 FINDINGS: Lower chest: No acute abnormality. Hepatobiliary: No focal liver abnormality is seen. No gallstones, gallbladder wall thickening, or biliary dilatation. Pancreas: Unremarkable. No pancreatic ductal dilatation or surrounding inflammatory changes. Spleen: Normal in size without focal abnormality. Adrenals/Urinary Tract: Adrenal glands are unremarkable. Kidneys are normal, without renal calculi, focal lesion, or hydronephrosis. Bladder is unremarkable. Stomach/Bowel: There is mild gastric fold and mucosal thickening of the stomach raising the possibility of a gastritis. No focal ulceration or perforation is noted. There is normal small bowel rotation without obstruction or inflammation. Normal appendix. There is transmural thickening of the descending colon through rectum likely secondary to underdistention. The possibility of a mild colitis is not excluded. Vascular/Lymphatic: Aortic atherosclerosis. No  enlarged abdominal or pelvic lymph nodes. Reproductive: Prostate is unremarkable. Other: No abdominal wall hernia or abnormality. No abdominopelvic ascites. Musculoskeletal: No acute osseous abnormality. Slight disc space narrowing L2-3. No aggressive lytic or blastic osseous lesions. IMPRESSION: 1. There appears to be mild gastric fold and mucosal thickening of the stomach raising the possibility of gastritis. No focal ulceration or perforation is identified. 2. Diffuse transmural thickening of the descending colon through rectum likely related to underdistention. Colitis could have this appearance as well and clinical correlation is therefore recommended. 3. Aortic atherosclerosis. Electronically Signed   By: Ashley Royalty M.D.   On: 03/23/2017 16:31   Dg Chest 2 View  Result Date: 03/23/2017 CLINICAL DATA:  Weakness. EXAM: CHEST  2 VIEW COMPARISON:  Chest x-ray dated March 28, 2016. FINDINGS: The cardiomediastinal silhouette is normal in size. Normal pulmonary vascularity. No focal consolidation, pleural effusion, or pneumothorax. No acute osseous abnormality. IMPRESSION: No active cardiopulmonary disease. Electronically Signed   By: Titus Dubin M.D.   On: 03/23/2017 13:41    Procedures Procedures (including critical care time)  Medications Ordered in ED Medications  iopamidol (ISOVUE-300) 61 % injection (not administered)  sodium chloride 0.9 % bolus 1,000 mL (1,000 mLs Intravenous New Bag/Given 03/23/17 1321)  sodium chloride 0.9 % bolus 1,000 mL (0 mLs Intravenous Stopped 03/23/17 1703)   CRITICAL CARE Performed by: Julianne Rice Total critical care time: 25 minutes Critical care time was exclusive of separately billable procedures and treating other patients. Critical care was necessary to treat or prevent imminent or life-threatening deterioration. Critical care was time spent personally by me on the following activities: development of treatment plan with patient and/or  surrogate as well as nursing, discussions with consultants, evaluation of patient's response to treatment, examination of patient, obtaining history from patient or surrogate, ordering and performing treatments and interventions, ordering and review of laboratory studies, ordering and review of radiographic studies, pulse oximetry and re-evaluation of patient's condition.  Initial Impression / Assessment and Plan / ED Course  I have reviewed the triage vital signs and the nursing notes.  Pertinent labs & imaging results that were available during my care of the patient were reviewed by me and considered in my medical decision making (see chart for details).     Blood pressure improved with IV fluids.  Patient appears to be in acute renal failure.  Question due to dehydration.  CT abdomen with thickened stomach wall.  Given history of weight loss concern for possible cancer.  Discussed with Dr. Lorin Mercy.  She will  admit to the hospital.  Final Clinical Impressions(s) / ED Diagnoses   Final diagnoses:  Acute renal failure, unspecified acute renal failure type ([place])  Hypotension due to hypovolemia    ED Discharge Orders    None       Julianne Rice, MD 03/23/17 1737

## 2017-03-23 NOTE — ED Notes (Signed)
Report to Val, RN

## 2017-03-23 NOTE — ED Notes (Signed)
Patient ambulatory to the restroom.

## 2017-03-23 NOTE — Progress Notes (Signed)
Patient arrived to dept 300 room 8. Alert and oriented x's 4. Placed on telemetry box # 36. Vital signs stable. No c/o pain or discomfort noted. Report received from [place] from ED.

## 2017-03-23 NOTE — ED Notes (Signed)
Pt awaiting time study CT

## 2017-03-23 NOTE — ED Notes (Signed)
Pt in CT.

## 2017-03-23 NOTE — Progress Notes (Signed)
Pt educated to use the urinal when voiding so Rn can keep up with his urine output. Pt stated he went up to the BR and "used it already." Pt given urinal and verbalized understanding.

## 2017-03-23 NOTE — ED Notes (Addendum)
Pt reports that he has let himself go for months Not eating right- today he continues with vomiting Reports this has gone on for months and he has not seen his Dr Dr Loyal Buba for this  Pt reeks of cig smoke, is thin with moist mucous membranes, is largely without teeth

## 2017-03-23 NOTE — Progress Notes (Signed)
EKG reviewed and shows borderline peaked T waves.  Will go ahead and give Calcium gluconate x 1 and repeat STAT BMP now.  Carlyon Shadow, M.D.

## 2017-03-23 NOTE — H&P (Addendum)
History and Physical    John Carroll QQV:[id number] DOB: 08-Feb-1961 DOA: 03/23/2017  PCP: [facility] Consultants:  None Patient coming from: Home - lives with mother; NOK: Mother or sister   Chief Complaint: anorexia  HPI: John Carroll is a 56 y.o. male with medical history significant of HTN, chronic back pain, and admission for acute renal failure due to pancreatitis in 5/18 presenting with anorexia.  He reports that the last time he was here (5/18) his mom went through a brain surgery.  He took care of her until he let himself go.  This time it was a hip surgery.  Throughout all that, when he was trying to get his teeth, the approval lapsed.  He was waiting to get another approval to get his teeth fixed.  He got that in and then his mother needed a hip replacement.  She just got home and he came up here.  He just got to where he couldn't eat.  He couldn't eat certain foods because of his teeth.  But now he can't eat much of anything and he was shaking real bad.  He was unable to sign his name at the bank yesterday because he was shaking so bad.  He does not have abdominal pain but he couldn't taste anything.  He can't really chew and just got tired of eating soft foods.  He thinks he has lost 20-30 pounds since his last hospitalization but he isn't certain.  The only thing he has really eaten was protein drinks and he could keep those down.  No fevers.     ED Course: Hypotension, improved with IVF.  Acute renal failure - ?dehydration.  CT abdomen with thickened stomach wall; with h/o weight loss, concern for malignancy.  Review of Systems: As per HPI; otherwise review of systems reviewed and negative.   Ambulatory Status:  Ambulates without assistance  Past Medical History:  Diagnosis Date  . Acute renal failure (ARF) ([place]) 5/18, 11/18  . Chronic back pain   . Epididymitis   . Hypertension    stopped medications due to low BPs  . Pancreatitis 08/2016    History  reviewed. No pertinent surgical history.  Social History   Socioeconomic History  . Marital status: Divorced    Spouse name: Not on file  . Number of children: Not on file  . Years of education: Not on file  . Highest education level: Not on file  Social Needs  . Financial resource strain: Not on file  . Food insecurity - worry: Not on file  . Food insecurity - inability: Not on file  . Transportation needs - medical: Not on file  . Transportation needs - non-medical: Not on file  Occupational History  . Occupation: disabled  Tobacco Use  . Smoking status: Current Every Day Smoker    Packs/day: 1.00    Years: 27.00    Pack years: 27.00    Types: Cigarettes  . Smokeless tobacco: Never Used  Substance and Sexual Activity  . Alcohol use: No    Comment: quit after rehab  . Drug use: Yes    Types: Marijuana    Comment: occasional use, last use maybe a couple of weeks ago  . Sexual activity: Not on file  Other Topics Concern  . Not on file  Social History Narrative  . Not on file    No Known Allergies  Family History  Problem Relation Age of Onset  . Kidney disease Mother   .  Hypertension Mother   . Diabetes Mellitus II Mother   . Heart attack Father 42       Died of MI in 52  . Heart attack Sister   . Hypertension Sister     Prior to Admission medications   Medication Sig Start Date End Date Taking? Authorizing Provider  lisinopril-hydrochlorothiazide (PRINZIDE,ZESTORETIC) 20-12.5 MG tablet Take 1 tablet by mouth daily. 02/12/17  Yes [provider]  Multiple Vitamin (MULTIVITAMIN WITH MINERALS) TABS tablet Take 1 tablet by mouth daily.   Yes [provider]    Physical Exam: Vitals:   03/23/17 1630 03/23/17 1645 03/23/17 1700 03/23/17 1730  BP: 111/65 103/68 96/64 106/72  Pulse:  82 86 88  Resp:  10 10 (!) 31  Temp:      SpO2:  100% 99% 99%  Weight:      Height:         General: Appears calm and comfortable and is NAD; he is  cachectic Eyes:  PERRL, EOMI, normal lids, iris ENT:  grossly normal hearing, lips & tongue, mmm; very few teeth present Neck:  no LAD, masses or thyromegaly; no carotid bruits Cardiovascular:  RRR, no m/r/g. No LE edema.  Respiratory:   CTA bilaterally with no wheezes/rales/rhonchi.  Normal respiratory effort. Abdomen:  soft, NT, ND, NABS Back:   normal alignment, no CVAT Skin:  no rash or induration seen on limited exam Musculoskeletal:  grossly normal tone BUE/BLE, good ROM, no bony abnormality Lower extremity:  No LE edema. 2+ distal pulses. Psychiatric:  grossly normal mood and affect, speech fluent and appropriate, AOx3 Neurologic:  CN 2-12 grossly intact, moves all extremities in coordinated fashion, sensation intact    Radiological Exams on Admission: Ct Abdomen Pelvis Wo Contrast  Result Date: 03/23/2017 CLINICAL DATA:  Unspecified abdominal pain. EXAM: CT ABDOMEN AND PELVIS WITHOUT CONTRAST TECHNIQUE: Multidetector CT imaging of the abdomen and pelvis was performed following the standard protocol without IV contrast. COMPARISON:  09/23/2016 FINDINGS: Lower chest: No acute abnormality. Hepatobiliary: No focal liver abnormality is seen. No gallstones, gallbladder wall thickening, or biliary dilatation. Pancreas: Unremarkable. No pancreatic ductal dilatation or surrounding inflammatory changes. Spleen: Normal in size without focal abnormality. Adrenals/Urinary Tract: Adrenal glands are unremarkable. Kidneys are normal, without renal calculi, focal lesion, or hydronephrosis. Bladder is unremarkable. Stomach/Bowel: There is mild gastric fold and mucosal thickening of the stomach raising the possibility of a gastritis. No focal ulceration or perforation is noted. There is normal small bowel rotation without obstruction or inflammation. Normal appendix. There is transmural thickening of the descending colon through rectum likely secondary to underdistention. The possibility of a mild colitis  is not excluded. Vascular/Lymphatic: Aortic atherosclerosis. No enlarged abdominal or pelvic lymph nodes. Reproductive: Prostate is unremarkable. Other: No abdominal wall hernia or abnormality. No abdominopelvic ascites. Musculoskeletal: No acute osseous abnormality. Slight disc space narrowing L2-3. No aggressive lytic or blastic osseous lesions. IMPRESSION: 1. There appears to be mild gastric fold and mucosal thickening of the stomach raising the possibility of gastritis. No focal ulceration or perforation is identified. 2. Diffuse transmural thickening of the descending colon through rectum likely related to underdistention. Colitis could have this appearance as well and clinical correlation is therefore recommended. 3. Aortic atherosclerosis. Electronically Signed   By: Ashley Royalty M.D.   On: 03/23/2017 16:31   Dg Chest 2 View  Result Date: 03/23/2017 CLINICAL DATA:  Weakness. EXAM: CHEST  2 VIEW COMPARISON:  Chest x-ray dated March 28, 2016. FINDINGS: The cardiomediastinal silhouette  is normal in size. Normal pulmonary vascularity. No focal consolidation, pleural effusion, or pneumothorax. No acute osseous abnormality. IMPRESSION: No active cardiopulmonary disease. Electronically Signed   By: Titus Dubin M.D.   On: 03/23/2017 13:41    EKG: Independently reviewed.  NSR with rate 96; low voltage with no evidence of acute ischemia On telemetry there was concern for peaked t waves   Labs on Admission: I have personally reviewed the available labs and imaging studies at the time of the admission.  Pertinent labs:   Na++ 132 K+ 5.9 CO2 16 Glucose 111 BUN 221/Creatinine 6.83/GFR 8 Lipase 148 Lactate 0.36 Hgb 10.5 UA WNL UDS positive for BZD and THC  Assessment/Plan Principal Problem:   Acute renal failure (ARF) ([facility]) Active Problems:   Hypertension   Hyponatremia   Marijuana abuse   Tobacco abuse   Hyperglycemia   Unexplained weight loss    Acute renal failure -Creatinine  was 1.4 in 5/18 at the time of discharge -Patient acknowledges poor PO intake including liquids so suspect that this is the result of chronic but severe dehydration -Has h/o this in 11/17 and had a normal renal US at that time; also in 5/18 -He does have mild to moderate hyperkalemia now, K+ 5.9; will monitor without intervention as this is likely to improve with aggressive hydration and improvement in creatinine.  Will repeat EKG to r/o peaked T waves and if present will treat with calcium gluconate as a temporizing measure. -Hyponatremia is likely related to dehydration -Initial hypotension responded to volume resuscitation -IVF at 125 cc/hr for now -Follow up renal function by BMP -Avoid ACEI and NSAIDs -He is making urine and CO2 is only mildly depressed so no indication for dialysis at this time  Unexplained weight loss -Patient with anorexia and marked weight loss -He was 143 pounds in 11/17, 135 in 5/18, and is 110 now -His BMI is down to 16.73 -He reports that this is due to inability to eat many foods because of his poor dentition -He then reports that he got tired of eating soft foods and so stopped eating altogether -With his abnormal CT finding of possible gastric thickening, gastric malignancy is a concern.  With his h/o pancreatitis, pancreatitic cancer should also be considered but CT of the pancreas today appeared to be normal. -Will request GI consult and make patient NPO after midnight for possible EGD tomorrow.  HTN -Given now 3 episodes of renal failure requiring hospitalization since Nov 2017, would recommend discontinuation of ACE and HCTZ permanently (although if CKD then he may benefit from a low dose of ACE for renal protection in the future) -With his cachexia, it seems unlikely that he will actually need medication for  HTN  Hyperglycemia -A1c 5.7 in 5/18 -For now will simply monitor with fasting labs -He may have pancreatic dysfunction acutely and/or  chronically that limits his ability to control his blood sugar for now.    Marijuana, BZD and tobacco abuse -Encourage cessation.  This was discussed with the patient and should be reviewed on an ongoing basis.   -Nicotine patch ordered at patient request.   DVT prophylaxis:  Lovenox Code Status:  Full - confirmed with patient Family Communication: None present  Disposition Plan:  Home once clinically improved Consults called: GI  Admission status: Admit - It is my clinical opinion that admission to INPATIENT is reasonable and necessary because this patient will require at least 2 midnights in the hospital to treat this condition based on the  medical complexity of the problems presented.  Given the aforementioned information, the predictability of an adverse outcome is felt to be significant.     Karmen Bongo MD [facility]  If note is complete, please contact covering daytime or nighttime physician. [email or web address] Password [email or web address]  03/23/2017, 5:59 PM

## 2017-03-24 ENCOUNTER — Other Ambulatory Visit: Payer: Self-pay

## 2017-03-24 DIAGNOSIS — N179 Acute kidney failure, unspecified: Principal | ICD-10-CM

## 2017-03-24 DIAGNOSIS — R627 Adult failure to thrive: Secondary | ICD-10-CM

## 2017-03-24 DIAGNOSIS — R634 Abnormal weight loss: Secondary | ICD-10-CM

## 2017-03-24 DIAGNOSIS — E871 Hypo-osmolality and hyponatremia: Secondary | ICD-10-CM

## 2017-03-24 DIAGNOSIS — I9589 Other hypotension: Secondary | ICD-10-CM

## 2017-03-24 DIAGNOSIS — E861 Hypovolemia: Secondary | ICD-10-CM

## 2017-03-24 DIAGNOSIS — E875 Hyperkalemia: Secondary | ICD-10-CM

## 2017-03-24 DIAGNOSIS — Z720 Tobacco use: Secondary | ICD-10-CM

## 2017-03-24 DIAGNOSIS — F1210 Cannabis abuse, uncomplicated: Secondary | ICD-10-CM

## 2017-03-24 LAB — CBC
HCT: 29.2 % — ABNORMAL LOW (ref 39.0–52.0)
Hemoglobin: 9.3 g/dL — ABNORMAL LOW (ref 13.0–17.0)
MCH: 30.7 pg (ref 26.0–34.0)
MCHC: 31.8 g/dL (ref 30.0–36.0)
MCV: 96.4 fL (ref 78.0–100.0)
PLATELETS: 202 10*3/uL (ref 150–400)
RBC: 3.03 MIL/uL — AB (ref 4.22–5.81)
RDW: 13.8 % (ref 11.5–15.5)
WBC: 6.3 10*3/uL (ref 4.0–10.5)

## 2017-03-24 LAB — HEPATIC FUNCTION PANEL
ALT: 11 U/L — ABNORMAL LOW (ref 17–63)
AST: 17 U/L (ref 15–41)
Albumin: 3.7 g/dL (ref 3.5–5.0)
Alkaline Phosphatase: 52 U/L (ref 38–126)
BILIRUBIN INDIRECT: 0.8 mg/dL (ref 0.3–0.9)
Bilirubin, Direct: 0.1 mg/dL (ref 0.1–0.5)
TOTAL PROTEIN: 6.4 g/dL — AB (ref 6.5–8.1)
Total Bilirubin: 0.9 mg/dL (ref 0.3–1.2)

## 2017-03-24 LAB — BASIC METABOLIC PANEL
Anion gap: 10 (ref 5–15)
BUN: 168 mg/dL — ABNORMAL HIGH (ref 6–20)
CALCIUM: 9.2 mg/dL (ref 8.9–10.3)
CO2: 19 mmol/L — AB (ref 22–32)
CREATININE: 4.19 mg/dL — AB (ref 0.61–1.24)
Chloride: 109 mmol/L (ref 101–111)
GFR calc non Af Amer: 14 mL/min — ABNORMAL LOW (ref >60)
GFR, EST AFRICAN AMERICAN: 17 mL/min — AB (ref >60)
GLUCOSE: 79 mg/dL (ref 65–99)
Potassium: 5.6 mmol/L — ABNORMAL HIGH (ref 3.5–5.1)
Sodium: 138 mmol/L (ref 135–145)

## 2017-03-24 LAB — FERRITIN: FERRITIN: 1172 ng/mL — AB (ref 24–336)

## 2017-03-24 LAB — RETICULOCYTES
RBC.: 3.29 MIL/uL — ABNORMAL LOW (ref 4.22–5.81)
RETIC COUNT ABSOLUTE: 65.8 10*3/uL (ref 19.0–186.0)
Retic Ct Pct: 2 % (ref 0.4–3.1)

## 2017-03-24 LAB — IRON AND TIBC
IRON: 88 ug/dL (ref 45–182)
Saturation Ratios: 34 % (ref 17.9–39.5)
TIBC: 262 ug/dL (ref 250–450)
UIBC: 174 ug/dL

## 2017-03-24 LAB — MAGNESIUM: MAGNESIUM: 2.9 mg/dL — AB (ref 1.7–2.4)

## 2017-03-24 LAB — TSH: TSH: 5.515 u[IU]/mL — AB (ref 0.350–4.500)

## 2017-03-24 LAB — VITAMIN B12: Vitamin B-12: 710 pg/mL (ref 180–914)

## 2017-03-24 MED ORDER — SODIUM CHLORIDE 0.9 % IV SOLN
INTRAVENOUS | Status: DC
  Administered 2017-03-24: 10:00:00 via INTRAVENOUS

## 2017-03-24 MED ORDER — SODIUM POLYSTYRENE SULFONATE 15 GM/60ML PO SUSP
30.0000 g | Freq: Once | ORAL | Status: AC
  Administered 2017-03-24: 30 g via ORAL
  Filled 2017-03-24: qty 120

## 2017-03-24 MED ORDER — BOOST / RESOURCE BREEZE PO LIQD
1.0000 | Freq: Three times a day (TID) | ORAL | Status: DC
  Administered 2017-03-24 – 2017-03-26 (×4): 1 via ORAL

## 2017-03-24 MED ORDER — PANTOPRAZOLE SODIUM 40 MG PO TBEC
40.0000 mg | DELAYED_RELEASE_TABLET | Freq: Two times a day (BID) | ORAL | Status: DC
  Administered 2017-03-24 – 2017-03-27 (×6): 40 mg via ORAL
  Filled 2017-03-24 (×6): qty 1

## 2017-03-24 MED ORDER — ENSURE PO LIQD: 237.0000 mL | Freq: Three times a day (TID) | ORAL | Status: DC

## 2017-03-24 NOTE — Progress Notes (Signed)
PROGRESS NOTE  John Carroll GNF:[id number] DOB: 02/01/1961 DOA: 03/23/2017 PCP: [facility] [place]  Brief History:  56 year old male with a history of hypertension, chronic back pain, and tobacco abuse presenting with generalized weakness, weight loss and anorexia.  The patient states that he has lost 50 pounds in the last 6 months.  He states that he has little desire to eat in part due to his edentulous status.  He has had some difficulty obtaining dentures, but he tells me that he has now scheduled to receive his dentures the week after Thanksgiving.  Nonetheless, the patient states that "I have just let myself go".  He also attributes his poor po intake to having to take care of his mother.  He denies any fevers, chills, chest pain, shortness breath, nausea, vomiting, diarrhea, abdominal pain, dysuria, hematuria, hematochezia, melena.  He denies any dysphagia or odynophagia. Upon presentation, the patient was afebrile, but hypotensive with a blood pressure of 86/59.  Serum creatinine was 6.83 with hemoglobin 9.3 at the time of admission.  The patient had a similar presentation when he was admitted from Sep 23, 2016 through Sep 26, 2016.  Patient was started on intravenous fluids.  GI was consulted secondary to thickening of his stomach on CT imaging.  Assessment/Plan: AKI -Secondary to volume depletion -Renal ultrasound -Continue IV fluids  Failure to thrive/weight loss -TSH -Serum B12 -Nutrition consult -03/23/17 CT abd--mild gastric fold and mucosal thickening of the stomach transmural; thickening of descending colon to rectum likely due to under distention -Consult GI  -Personally reviewed chest x-ray--hyperinflation, no infiltrates -UA neg for pyuria  Tobacco/THC/benzodiazepine abuse -Patient adamantly denies any BZD use; states he last used THC 6 months ago? -Tobacco cessation discussed -NicoDerm patch  Hypotension -Patient has history of  hypertension -Discontinue lisinopril/HCTZ--patient has not taken for over 1 month  Hyperkalemia -Give Kayexalate -Personally reviewed EKG--sinus rhythm, nonspecific T wave change  Hyponatremia -due to poor solute intake and volume depletion -improved with IVF    Disposition Plan:   Home in 1-2 days  Family Communication:  No Family at bedside  Consultants:  GI  Code Status:  FULL   DVT Prophylaxis:  [place] Lovenox   Procedures: As Listed in Progress Note Above  Antibiotics: None    Subjective: Patient denies fevers, chills, headache, chest pain, dyspnea, nausea, vomiting, diarrhea, abdominal pain, dysuria, hematuria, hematochezia, and melena.   Objective: Vitals:   03/23/17 2054 03/23/17 2143 03/24/17 0407 03/24/17 0408  BP: 111/64  (!) 93/52 (!) 105/57  Pulse: 85  80   Resp: 12  14   Temp: 98.2 F (36.8 C)  98.3 F (36.8 C)   TempSrc: Oral  Oral   SpO2: 99% 98% 99%   Weight:      Height:        Intake/Output Summary (Last 24 hours) at 03/24/2017 0809 Last data filed at 03/24/2017 0410 Gross per 24 hour  Intake 1350.42 ml  Output 1200 ml  Net 150.42 ml   Weight change:  Exam:   General:  Pt is alert, follows commands appropriately, not in acute distress  HEENT: No icterus, No thrush, No neck mass, [place]/AT  Cardiovascular: RRR, S1/S2, no rubs, no gallops  Respiratory: CTA bilaterally, no wheezing, no crackles, no rhonchi  Abdomen: Soft/+BS, non tender, non distended, no guarding  Extremities: No edema, No lymphangitis, No petechiae, No rashes, no synovitis   Data Reviewed: I have personally reviewed following labs  and imaging studies Basic Metabolic Panel: Recent Labs  Lab 03/23/17 1237 03/23/17 2121 03/24/17 0417  NA 132* 134* 138  K 5.9* 5.4* 5.6*  CL 102 107 109  CO2 16* 17* 19*  GLUCOSE 111* 110* 79  BUN 221* 188* 168*  CREATININE 6.83* 4.93* 4.19*  CALCIUM 9.7 8.7* 9.2  MG  --   --  2.9*   Liver Function Tests: Recent Labs    Lab 03/23/17 1237 03/24/17 0417  AST 15 17  ALT 12* 11*  ALKPHOS 61 52  BILITOT 0.9 0.9  PROT 7.6 6.4*  ALBUMIN 4.4 3.7   Recent Labs  Lab 03/23/17 1237  LIPASE 148*   No results for input(s): AMMONIA in the last 168 hours. Coagulation Profile: No results for input(s): INR, PROTIME in the last 168 hours. CBC: Recent Labs  Lab 03/23/17 1237 03/24/17 0417  WBC 7.7 6.3  HGB 10.5* 9.3*  HCT 32.7* 29.2*  MCV 95.6 96.4  PLT 211 202   Cardiac Enzymes: No results for input(s): CKTOTAL, CKMB, CKMBINDEX, TROPONINI in the last 168 hours. BNP: Invalid input(s): POCBNP CBG: Recent Labs  Lab 03/23/17 1300  GLUCAP 103*   HbA1C: No results for input(s): HGBA1C in the last 72 hours. Urine analysis:    Component Value Date/Time   COLORURINE YELLOW 03/23/2017 1228   APPEARANCEUR CLEAR 03/23/2017 1228   LABSPEC 1.013 03/23/2017 1228   PHURINE 5.0 03/23/2017 1228   GLUCOSEU NEGATIVE 03/23/2017 1228   HGBUR NEGATIVE 03/23/2017 1228   BILIRUBINUR NEGATIVE 03/23/2017 1228   KETONESUR NEGATIVE 03/23/2017 1228   PROTEINUR NEGATIVE 03/23/2017 1228   UROBILINOGEN 0.2 12/02/2012 0913   NITRITE NEGATIVE 03/23/2017 1228   LEUKOCYTESUR NEGATIVE 03/23/2017 1228   Sepsis Labs: @LABRCNTIP (procalcitonin:4,lacticidven:4) )No results found for this or any previous visit (from the past 240 hour(s)).   Scheduled Meds: . docusate sodium  100 mg Oral BID  . enoxaparin (LOVENOX) injection  30 mg Subcutaneous Q24H  . nicotine  14 mg Transdermal Daily  . sodium chloride flush  3 mL Intravenous Q12H   Continuous Infusions: . lactated ringers 125 mL/hr at 03/24/17 0405    Procedures/Studies: Ct Abdomen Pelvis Wo Contrast  Result Date: 03/23/2017 CLINICAL DATA:  Unspecified abdominal pain. EXAM: CT ABDOMEN AND PELVIS WITHOUT CONTRAST TECHNIQUE: Multidetector CT imaging of the abdomen and pelvis was performed following the standard protocol without IV contrast. COMPARISON:  09/23/2016  FINDINGS: Lower chest: No acute abnormality. Hepatobiliary: No focal liver abnormality is seen. No gallstones, gallbladder wall thickening, or biliary dilatation. Pancreas: Unremarkable. No pancreatic ductal dilatation or surrounding inflammatory changes. Spleen: Normal in size without focal abnormality. Adrenals/Urinary Tract: Adrenal glands are unremarkable. Kidneys are normal, without renal calculi, focal lesion, or hydronephrosis. Bladder is unremarkable. Stomach/Bowel: There is mild gastric fold and mucosal thickening of the stomach raising the possibility of a gastritis. No focal ulceration or perforation is noted. There is normal small bowel rotation without obstruction or inflammation. Normal appendix. There is transmural thickening of the descending colon through rectum likely secondary to underdistention. The possibility of a mild colitis is not excluded. Vascular/Lymphatic: Aortic atherosclerosis. No enlarged abdominal or pelvic lymph nodes. Reproductive: Prostate is unremarkable. Other: No abdominal wall hernia or abnormality. No abdominopelvic ascites. Musculoskeletal: No acute osseous abnormality. Slight disc space narrowing L2-3. No aggressive lytic or blastic osseous lesions. IMPRESSION: 1. There appears to be mild gastric fold and mucosal thickening of the stomach raising the possibility of gastritis. No focal ulceration or perforation is identified. 2. Diffuse transmural  thickening of the descending colon through rectum likely related to underdistention. Colitis could have this appearance as well and clinical correlation is therefore recommended. 3. Aortic atherosclerosis. Electronically Signed   By: Ashley Royalty M.D.   On: 03/23/2017 16:31   Dg Chest 2 View  Result Date: 03/23/2017 CLINICAL DATA:  Weakness. EXAM: CHEST  2 VIEW COMPARISON:  Chest x-ray dated March 28, 2016. FINDINGS: The cardiomediastinal silhouette is normal in size. Normal pulmonary vascularity. No focal consolidation,  pleural effusion, or pneumothorax. No acute osseous abnormality. IMPRESSION: No active cardiopulmonary disease. Electronically Signed   By: Titus Dubin M.D.   On: 03/23/2017 13:41    Markian Glockner, DO  [facility] Pager [phone]  If 7PM-7AM, please contact night-coverage [email or web address] Password [email or web address] 03/24/2017, 8:09 AM   LOS: 1 day

## 2017-03-24 NOTE — H&P (View-Only) (Signed)
Referring Provider: No ref. provider found Primary Care Physician:  [facility] [place] Primary Gastroenterologist:  Barney Drain  Reason for Consultation:  ABNORMAL GASTRIC MUCOSA ON CT    Impression: ADMITTED WITH ACUTE ON CHRONIC RENAL FAILURE DUE TO ?POOR PO INTAKE/PRERENAL AZOTEMIA. PT UNABLE TO EAT DUE TO DENTAL ISSUES(HAD TEETH PULLED, GUMS DON'T MEET). GETS HIS TEETH NEXT WEEK. DRINKS PROTEIN SHAKES AND GATORADE/WATER.   HAD PANCREATITIS/POS H PYLORI SEROLOGY IN MAY 2018. REPORTS HE TOOK ABX FOR H PYLORI?. HE DOESN'T DRINK ETOH & UDS POS FOR BZD,OPIATES, THC IN MAY 2018 AND NOV 2018 POS FOR BZD/THC. CURRENTLY K 5.6, BUN 168, Cr 4.19. RENAL FAILURE RESOLVING.   Plan: 1. FLUID RESUSCITATE 2. EGD/TCS W/ MAC AS AN OUTPT WITHIN 1-2 WEEKS. NEEDS BIOPSIES TO CONFIRM H PYLORI ERADICATED. 3. BID PROTONIX 4. FULL LIQUID DIET WITH RESOURCE BREEZE TID BM. 5. NEEDS MRCP AS AN OUTPT TO COMPLETE EVALUATION FOR HIS IDIOPATHIC PANCREATITIS IN 2-3 WEEKS.       HPI:  PT IN HIS [place]. HAVING TROUBLE WITH CHEWING DUE TO  EXTRACTION OF HIS TEETH AND WAITING ON UPPER PLATE AND PARTIAL. CAME TO ED BECAUSE "HE HAS TEETH PROBLEMS", SHAKING/COULDN'T WRITE HIS NAME AT THE BANK, AND FATIGUE. IN ED WORKUP REVEALED BP 96/64 AND BUN 221/Cr 6.83/K5.9. PROTEIN SHAKES GIVE HIM PASTY STOOLS/DIARRHEA. HAD A COLONOSCOPY LINED UP IN [place] BUT HAD TO CANCEL DUE TO AN EMERGENCY. HE TAKE SCARE OF HIS MOTHER AND SHE HAD BRAIN AND HIP SURGERY IN THE PAST YEAR. HE HAS A DOCUMENTED 14 LB WEIGHT SINCE NOV 2017. APPETITE IS GOOD BUT HE JUST CANNOT CHEW BECAUSE HIS GUMS DON'T MEET.    PT DENIES FEVER, CHILLS, HEMATOCHEZIA, HEMATEMESIS, nausea, vomiting, melena,  CHEST PAIN, SHORTNESS OF BREATH,  CHANGE IN BOWEL IN HABITS, constipation, abdominal pain, problems swallowing, OR heartburn or indigestion.  Past Medical History:  Diagnosis Date  . Acute renal failure (ARF) ([place]) 5/18, 11/18  .  Chronic back pain   . Epididymitis   . Hypertension    stopped medications due to low BPs  . Pancreatitis 08/2016   History reviewed. No pertinent surgical history.   Prior to Admission medications   Medication Sig Start Date End Date Taking? Authorizing Provider  Multiple Vitamin (MULTIVITAMIN WITH MINERALS) TABS tablet Take 1 tablet by mouth daily.   Yes [provider]    Current Facility-Administered Medications  Medication Dose Route Frequency Provider Last Rate Last Dose  . 0.9 %  sodium chloride infusion   Intravenous Continuous Tat, Shanon Brow, MD 100 mL/hr at 03/24/17 0943    . acetaminophen (TYLENOL) tablet 650 mg  650 mg Oral Q6H PRN Karmen Bongo, MD       Or  . acetaminophen (TYLENOL) suppository 650 mg  650 mg Rectal Q6H PRN Karmen Bongo, MD      . docusate sodium (COLACE) capsule 100 mg  100 mg Oral BID Karmen Bongo, MD   100 mg at 03/24/17 0949  . enoxaparin (LOVENOX) injection 30 mg  30 mg Subcutaneous Q24H Karmen Bongo, MD   30 mg at 03/23/17 2025  . feeding supplement (BOOST / RESOURCE BREEZE) liquid 1 Container  1 Container Oral TID BM Kirin Brandenburger L, MD      . nicotine (NICODERM CQ - dosed in mg/24 hours) patch 14 mg  14 mg Transdermal Daily Karmen Bongo, MD   14 mg at 03/24/17 0944  . ondansetron (ZOFRAN) tablet 4 mg  4 mg Oral Q6H PRN  Karmen Bongo, MD       Or  . ondansetron [facility]) injection 4 mg  4 mg Intravenous Q6H PRN Karmen Bongo, MD      .           . sodium chloride flush (NS) 0.9 % injection 3 mL  3 mL Intravenous Q12H Karmen Bongo, MD   3 mL at 03/23/17 2026   Allergies as of 03/23/2017  . (No Known Allergies)   Family History  Problem Relation Age of Onset  . Kidney disease Mother   . Hypertension Mother   . Diabetes Mellitus II Mother   . Heart attack Father 60       Died of MI in 77  . Heart attack Sister   . Hypertension Sister    Social History   Socioeconomic History  . Marital status: Divorced     Spouse name: Not on file  . Number of children: Not on file  . Years of education: Not on file  . Highest education level: Not on file  Social Needs  . Financial resource strain: Not on file  . Food insecurity - worry: Not on file  . Food insecurity - inability: Not on file  . Transportation needs - medical: Not on file  . Transportation needs - non-medical: Not on file  Occupational History  . Occupation: disabled  Tobacco Use  . Smoking status: Current Every Day Smoker    Packs/day: 1.00    Years: 27.00    Pack years: 27.00    Types: Cigarettes  . Smokeless tobacco: Never Used  Substance and Sexual Activity  . Alcohol use: No    Comment: quit after rehab  . Drug use: Yes    Types: Marijuana    Comment: occasional use, last use maybe a couple of weeks ago  . Sexual activity: Not on file  Other Topics Concern  . Not on file  Social History Narrative  . Not on file   Review of Systems: PER HPI OTHERWISE ALL SYSTEMS ARE NEGATIVE.  Vitals: Blood pressure (!) 105/57, pulse 80, temperature 98.3 F (36.8 C), temperature source Oral, resp. rate 14, height _0  (1.753 m), weight 125 lb 1.6 oz (56.7 kg), SpO2 99 %.  Physical Exam: General:   Alert,  Well-developed, well-nourished, pleasant and cooperative in NAD Head:  Normocephalic and atraumatic. Eyes:  Sclera clear, no icterus.   Conjunctiva pink. Mouth:  No lesions, dentition ABnormal. Neck:  Supple; no masses. Lungs:  Clear throughout to auscultation. FAIR AIR MOVEMENT BILATERALLY.  No wheezes. No acute distress. Heart:  Regular rate and rhythm; no murmurs. Abdomen:  Soft, nontender and nondistended. No masses noted. Normal bowel sounds, without guarding, and without rebound.   Msk:  Symmetrical without gross deformities.  Extremities:  Without edema. Neurologic:  Alert and  oriented x4;  grossly normal neurologically. Cervical Nodes:  No significant cervical adenopathy. Psych:  Alert and cooperative. Normal mood and  affect.  Lab Results: Recent Labs    03/23/17 1237 03/24/17 0417  WBC 7.7 6.3  HGB 10.5* 9.3*  HCT 32.7* 29.2*  PLT 211 202   BMET Recent Labs    03/23/17 2121 03/24/17 0417  NA 134* 138  K 5.4* 5.6*  CL 107 109  CO2 17* 19*  GLUCOSE 110* 79  BUN 188* 168*  CREATININE 4.93* 4.19*  CALCIUM 8.7* 9.2   LFT Recent Labs    03/24/17 0417  PROT 6.4*  ALBUMIN 3.7  AST 17  ALT  11*  ALKPHOS 52  BILITOT 0.9  BILIDIR 0.1  IBILI 0.8     Studies/Results: CT MAY 2018: inflammatory stranding and free fluid adjacent to the distal stomach and pancreatic head and the first and second portion of the duodenum. CT ABD/PELVIS WO IVC NOV 2018: mild gastric fold and mucosal thickening of the stomach raising the possibility of a gastritis. No focal ulceration or perforation is noted. Diffuse transmural thickening of the descending colon through rectum likely related to underdistention.   LOS: 1 day   [facility]  03/24/2017, 10:22 AM

## 2017-03-24 NOTE — Consult Note (Addendum)
Referring Provider: No ref. provider found Primary Care Physician:  [facility] [place] Primary Gastroenterologist:  Barney Drain  Reason for Consultation:  ABNORMAL GASTRIC MUCOSA ON CT    Impression: ADMITTED WITH ACUTE ON CHRONIC RENAL FAILURE DUE TO ?POOR PO INTAKE/PRERENAL AZOTEMIA. PT UNABLE TO EAT DUE TO DENTAL ISSUES(HAD TEETH PULLED, GUMS DON'T MEET). GETS HIS TEETH NEXT WEEK. DRINKS PROTEIN SHAKES AND GATORADE/WATER.   HAD PANCREATITIS/POS H PYLORI SEROLOGY IN MAY 2018. REPORTS HE TOOK ABX FOR H PYLORI?. HE DOESN'T DRINK ETOH & UDS POS FOR BZD,OPIATES, THC IN MAY 2018 AND NOV 2018 POS FOR BZD/THC. CURRENTLY K 5.6, BUN 168, Cr 4.19. RENAL FAILURE RESOLVING.   Plan: 1. FLUID RESUSCITATE 2. EGD/TCS W/ MAC AS AN OUTPT WITHIN 1-2 WEEKS. NEEDS BIOPSIES TO CONFIRM H PYLORI ERADICATED. 3. BID PROTONIX 4. FULL LIQUID DIET WITH RESOURCE BREEZE TID BM. 5. NEEDS MRCP AS AN OUTPT TO COMPLETE EVALUATION FOR HIS IDIOPATHIC PANCREATITIS IN 2-3 WEEKS.       HPI:  PT IN HIS [place]. HAVING TROUBLE WITH CHEWING DUE TO  EXTRACTION OF HIS TEETH AND WAITING ON UPPER PLATE AND PARTIAL. CAME TO ED BECAUSE "HE HAS TEETH PROBLEMS", SHAKING/COULDN'T WRITE HIS NAME AT THE BANK, AND FATIGUE. IN ED WORKUP REVEALED BP 96/64 AND BUN 221/Cr 6.83/K5.9. PROTEIN SHAKES GIVE HIM PASTY STOOLS/DIARRHEA. HAD A COLONOSCOPY LINED UP IN [place] BUT HAD TO CANCEL DUE TO AN EMERGENCY. HE TAKE SCARE OF HIS MOTHER AND SHE HAD BRAIN AND HIP SURGERY IN THE PAST YEAR. HE HAS A DOCUMENTED 14 LB WEIGHT SINCE NOV 2017. APPETITE IS GOOD BUT HE JUST CANNOT CHEW BECAUSE HIS GUMS DON'T MEET.    PT DENIES FEVER, CHILLS, HEMATOCHEZIA, HEMATEMESIS, nausea, vomiting, melena,  CHEST PAIN, SHORTNESS OF BREATH,  CHANGE IN BOWEL IN HABITS, constipation, abdominal pain, problems swallowing, OR heartburn or indigestion.  Past Medical History:  Diagnosis Date  . Acute renal failure (ARF) ([place]) 5/18, 11/18  .  Chronic back pain   . Epididymitis   . Hypertension    stopped medications due to low BPs  . Pancreatitis 08/2016   History reviewed. No pertinent surgical history.   Prior to Admission medications   Medication Sig Start Date End Date Taking? Authorizing Provider  Multiple Vitamin (MULTIVITAMIN WITH MINERALS) TABS tablet Take 1 tablet by mouth daily.   Yes [provider]    Current Facility-Administered Medications  Medication Dose Route Frequency Provider Last Rate Last Dose  . 0.9 %  sodium chloride infusion   Intravenous Continuous Tat, Shanon Brow, MD 100 mL/hr at 03/24/17 0943    . acetaminophen (TYLENOL) tablet 650 mg  650 mg Oral Q6H PRN Karmen Bongo, MD       Or  . acetaminophen (TYLENOL) suppository 650 mg  650 mg Rectal Q6H PRN Karmen Bongo, MD      . docusate sodium (COLACE) capsule 100 mg  100 mg Oral BID Karmen Bongo, MD   100 mg at 03/24/17 0949  . enoxaparin (LOVENOX) injection 30 mg  30 mg Subcutaneous Q24H Karmen Bongo, MD   30 mg at 03/23/17 2025  . feeding supplement (BOOST / RESOURCE BREEZE) liquid 1 Container  1 Container Oral TID BM Broderick Fonseca L, MD      . nicotine (NICODERM CQ - dosed in mg/24 hours) patch 14 mg  14 mg Transdermal Daily Karmen Bongo, MD   14 mg at 03/24/17 0944  . ondansetron (ZOFRAN) tablet 4 mg  4 mg Oral Q6H PRN  Karmen Bongo, MD       Or  . ondansetron [facility]) injection 4 mg  4 mg Intravenous Q6H PRN Karmen Bongo, MD      .           . sodium chloride flush (NS) 0.9 % injection 3 mL  3 mL Intravenous Q12H Karmen Bongo, MD   3 mL at 03/23/17 2026   Allergies as of 03/23/2017  . (No Known Allergies)   Family History  Problem Relation Age of Onset  . Kidney disease Mother   . Hypertension Mother   . Diabetes Mellitus II Mother   . Heart attack Father 60       Died of MI in 77  . Heart attack Sister   . Hypertension Sister    Social History   Socioeconomic History  . Marital status: Divorced     Spouse name: Not on file  . Number of children: Not on file  . Years of education: Not on file  . Highest education level: Not on file  Social Needs  . Financial resource strain: Not on file  . Food insecurity - worry: Not on file  . Food insecurity - inability: Not on file  . Transportation needs - medical: Not on file  . Transportation needs - non-medical: Not on file  Occupational History  . Occupation: disabled  Tobacco Use  . Smoking status: Current Every Day Smoker    Packs/day: 1.00    Years: 27.00    Pack years: 27.00    Types: Cigarettes  . Smokeless tobacco: Never Used  Substance and Sexual Activity  . Alcohol use: No    Comment: quit after rehab  . Drug use: Yes    Types: Marijuana    Comment: occasional use, last use maybe a couple of weeks ago  . Sexual activity: Not on file  Other Topics Concern  . Not on file  Social History Narrative  . Not on file   Review of Systems: PER HPI OTHERWISE ALL SYSTEMS ARE NEGATIVE.  Vitals: Blood pressure (!) 105/57, pulse 80, temperature 98.3 F (36.8 C), temperature source Oral, resp. rate 14, height _0  (1.753 m), weight 125 lb 1.6 oz (56.7 kg), SpO2 99 %.  Physical Exam: General:   Alert,  Well-developed, well-nourished, pleasant and cooperative in NAD Head:  Normocephalic and atraumatic. Eyes:  Sclera clear, no icterus.   Conjunctiva pink. Mouth:  No lesions, dentition ABnormal. Neck:  Supple; no masses. Lungs:  Clear throughout to auscultation. FAIR AIR MOVEMENT BILATERALLY.  No wheezes. No acute distress. Heart:  Regular rate and rhythm; no murmurs. Abdomen:  Soft, nontender and nondistended. No masses noted. Normal bowel sounds, without guarding, and without rebound.   Msk:  Symmetrical without gross deformities.  Extremities:  Without edema. Neurologic:  Alert and  oriented x4;  grossly normal neurologically. Cervical Nodes:  No significant cervical adenopathy. Psych:  Alert and cooperative. Normal mood and  affect.  Lab Results: Recent Labs    03/23/17 1237 03/24/17 0417  WBC 7.7 6.3  HGB 10.5* 9.3*  HCT 32.7* 29.2*  PLT 211 202   BMET Recent Labs    03/23/17 2121 03/24/17 0417  NA 134* 138  K 5.4* 5.6*  CL 107 109  CO2 17* 19*  GLUCOSE 110* 79  BUN 188* 168*  CREATININE 4.93* 4.19*  CALCIUM 8.7* 9.2   LFT Recent Labs    03/24/17 0417  PROT 6.4*  ALBUMIN 3.7  AST 17  ALT  11*  ALKPHOS 52  BILITOT 0.9  BILIDIR 0.1  IBILI 0.8     Studies/Results: CT MAY 2018: inflammatory stranding and free fluid adjacent to the distal stomach and pancreatic head and the first and second portion of the duodenum. CT ABD/PELVIS WO IVC NOV 2018: mild gastric fold and mucosal thickening of the stomach raising the possibility of a gastritis. No focal ulceration or perforation is noted. Diffuse transmural thickening of the descending colon through rectum likely related to underdistention.   LOS: 1 day   [facility]  03/24/2017, 10:22 AM

## 2017-03-25 ENCOUNTER — Telehealth: Payer: Self-pay | Admitting: Gastroenterology

## 2017-03-25 LAB — BASIC METABOLIC PANEL
ANION GAP: 9 (ref 5–15)
BUN: 118 mg/dL — ABNORMAL HIGH (ref 6–20)
CALCIUM: 8.9 mg/dL (ref 8.9–10.3)
CO2: 19 mmol/L — ABNORMAL LOW (ref 22–32)
Chloride: 114 mmol/L — ABNORMAL HIGH (ref 101–111)
Creatinine, Ser: 2.61 mg/dL — ABNORMAL HIGH (ref 0.61–1.24)
GFR, EST AFRICAN AMERICAN: 30 mL/min — AB (ref >60)
GFR, EST NON AFRICAN AMERICAN: 26 mL/min — AB (ref >60)
GLUCOSE: 88 mg/dL (ref 65–99)
POTASSIUM: 4.7 mmol/L (ref 3.5–5.1)
Sodium: 142 mmol/L (ref 135–145)

## 2017-03-25 LAB — GLUCOSE, CAPILLARY: GLUCOSE-CAPILLARY: 114 mg/dL — AB (ref 65–99)

## 2017-03-25 MED ORDER — SODIUM CHLORIDE 0.9 % IV SOLN
INTRAVENOUS | Status: DC
  Administered 2017-03-26: 15:00:00 via INTRAVENOUS

## 2017-03-25 NOTE — Progress Notes (Signed)
PROGRESS NOTE  MATTOX SCHORR XLK:[id number] DOB: 1961-03-31 DOA: 03/23/2017 PCP: [facility] [place]  Brief History:  56 year old male with a history of hypertension, chronic back pain, and tobacco abuse presenting with generalized weakness, weight loss and anorexia.  The patient states that he has lost 50 pounds in the last 6 months.  He states that he has little desire to eat in part due to his edentulous status.  He has had some difficulty obtaining dentures, but he tells me that he has now scheduled to receive his dentures the week after Thanksgiving.  Nonetheless, the patient states that "I have just let myself go".  He also attributes his poor po intake to having to take care of his mother.  He denies any fevers, chills, chest pain, shortness breath, nausea, vomiting, diarrhea, abdominal pain, dysuria, hematuria, hematochezia, melena.  He denies any dysphagia or odynophagia. Upon presentation, the patient was afebrile, but hypotensive with a blood pressure of 86/59.  Serum creatinine was 6.83 with hemoglobin 9.3 at the time of admission.  The patient had a similar presentation when he was admitted from Sep 23, 2016 through Sep 26, 2016.  Patient was started on intravenous fluids.  GI was consulted secondary to thickening of his stomach on CT imaging.  Assessment/Plan: AKI -Secondary to volume depletion -CT abd--no hydronephrosis -Continue IV fluids-->improving  Failure to thrive/weight loss -TSH--5.515-->check free T4 -Serum B12--710 -Nutrition consult -03/23/17 CT abd--mild gastric fold and mucosal thickening of the stomach transmural; thickening of descending colon to rectum likely due to under distention -GI consult-->full liquids, Boost, endoscopy as outpt. 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 -Tobacco cessation discussed -NicoDerm patch  Hypotension -Patient has history of hypertension -Discontinue lisinopril/HCTZ--patient has not taken for over 1 month  Hyperkalemia -Given Kayexalate x 1-->improved -Personally reviewed EKG--sinus rhythm, nonspecific T wave change  Hyponatremia -due to poor solute intake and volume depletion -improved with IVF  [place] anemia -Iron saturation 34%, ferritin 1172 -B12--710    Disposition Plan:   Home 11/24 if stable  Family Communication:  No Family at bedside  Consultants:  GI  Code Status:  FULL   DVT Prophylaxis:  [place] Lovenox   Procedures: As Listed in Progress Note Above  Antibiotics: None       Subjective: Patient denies fevers, chills, headache, chest pain, dyspnea, nausea, vomiting, diarrhea, abdominal pain, dysuria, hematuria, hematochezia, and melena.   Objective: Vitals:   03/24/17 0408 03/24/17 1429 03/24/17 1951 03/25/17 0300  BP: (!) 105/57 (!) 97/57 (!) 99/56 (!) 99/52  Pulse:  80 81 79  Resp:  18 20 18   Temp:  98 F (36.7 C) 98.2 F (36.8 C) 97.9 F (36.6 C)  TempSrc:   Oral Oral  SpO2:  100% 100% 97%  Weight:      Height:        Intake/Output Summary (Last 24 hours) at 03/25/2017 1452 Last data filed at 03/25/2017 1008 Gross per 24 hour  Intake 360 ml  Output 675 ml  Net -315 ml   Weight change:  Exam:   General:  Pt is alert, follows commands appropriately, not in acute distress  HEENT: No icterus, No thrush, No neck mass, [place]/AT  Cardiovascular: RRR, S1/S2, no rubs, no gallops  Respiratory: Diminished breath sounds  with bibasilar crackles.  No wheezing.  Good air movement.  Abdomen: Soft/+BS, non tender, non distended, no guarding  Extremities: No edema, No lymphangitis, No petechiae, No rashes, no synovitis   Data Reviewed: I have personally reviewed following labs and imaging studies Basic Metabolic  Panel: Recent Labs  Lab 03/23/17 1237 03/23/17 2121 03/24/17 0417 03/25/17 0621  NA 132* 134* 138 142  K 5.9* 5.4* 5.6* 4.7  CL 102 107 109 114*  CO2 16* 17* 19* 19*  GLUCOSE 111* 110* 79 88  BUN 221* 188* 168* 118*  CREATININE 6.83* 4.93* 4.19* 2.61*  CALCIUM 9.7 8.7* 9.2 8.9  MG  --   --  2.9*  --    Liver Function Tests: Recent Labs  Lab 03/23/17 1237 03/24/17 0417  AST 15 17  ALT 12* 11*  ALKPHOS 61 52  BILITOT 0.9 0.9  PROT 7.6 6.4*  ALBUMIN 4.4 3.7   Recent Labs  Lab 03/23/17 1237  LIPASE 148*   No results for input(s): AMMONIA in the last 168 hours. Coagulation Profile: No results for input(s): INR, PROTIME in the last 168 hours. CBC: Recent Labs  Lab 03/23/17 1237 03/24/17 0417  WBC 7.7 6.3  HGB 10.5* 9.3*  HCT 32.7* 29.2*  MCV 95.6 96.4  PLT 211 202   Cardiac Enzymes: No results for input(s): CKTOTAL, CKMB, CKMBINDEX, TROPONINI in the last 168 hours. BNP: Invalid input(s): POCBNP CBG: Recent Labs  Lab 03/23/17 1300  GLUCAP 103*   HbA1C: No results for input(s): HGBA1C in the last 72 hours. Urine analysis:    Component Value Date/Time   COLORURINE YELLOW 03/23/2017 1228   APPEARANCEUR CLEAR 03/23/2017 1228   LABSPEC 1.013 03/23/2017 1228   PHURINE 5.0 03/23/2017 1228   GLUCOSEU NEGATIVE 03/23/2017 1228   HGBUR NEGATIVE 03/23/2017 1228   BILIRUBINUR NEGATIVE 03/23/2017 1228   KETONESUR NEGATIVE 03/23/2017 1228   PROTEINUR NEGATIVE 03/23/2017 1228   UROBILINOGEN 0.2 12/02/2012 0913   NITRITE NEGATIVE 03/23/2017 1228   LEUKOCYTESUR NEGATIVE 03/23/2017 1228   Sepsis Labs: @LABRCNTIP (procalcitonin:4,lacticidven:4) )No results found for this or any previous visit (from the past 240 hour(s)).   Scheduled Meds: . docusate sodium  100 mg Oral BID  . enoxaparin (LOVENOX) injection  30 mg Subcutaneous Q24H  . feeding supplement  1 Container Oral TID BM  . nicotine  14 mg Transdermal Daily  . pantoprazole  40 mg Oral BID AC  . sodium  chloride flush  3 mL Intravenous Q12H   Continuous Infusions: . sodium chloride      Procedures/Studies: Ct Abdomen Pelvis Wo Contrast  Result Date: 03/23/2017 CLINICAL DATA:  Unspecified abdominal pain. EXAM: CT ABDOMEN AND PELVIS WITHOUT CONTRAST TECHNIQUE: Multidetector CT imaging of the abdomen and pelvis was performed following the standard protocol without IV contrast. COMPARISON:  09/23/2016 FINDINGS: Lower chest: No acute abnormality. Hepatobiliary: No focal liver abnormality is seen. No gallstones, gallbladder wall thickening, or biliary dilatation. Pancreas: Unremarkable. No pancreatic ductal dilatation or surrounding inflammatory changes. Spleen: Normal in size without focal abnormality. Adrenals/Urinary Tract: Adrenal glands are unremarkable. Kidneys are normal, without renal calculi, focal lesion, or hydronephrosis. Bladder is unremarkable. Stomach/Bowel: There is mild gastric fold and mucosal thickening of the stomach raising the possibility of a gastritis. No focal ulceration or perforation is noted. There is normal small bowel rotation without obstruction or inflammation. Normal appendix. There is transmural thickening of the descending colon through rectum likely secondary to underdistention. The possibility of a mild colitis is not excluded. Vascular/Lymphatic: Aortic  atherosclerosis. No enlarged abdominal or pelvic lymph nodes. Reproductive: Prostate is unremarkable. Other: No abdominal wall hernia or abnormality. No abdominopelvic ascites. Musculoskeletal: No acute osseous abnormality. Slight disc space narrowing L2-3. No aggressive lytic or blastic osseous lesions. IMPRESSION: 1. There appears to be mild gastric fold and mucosal thickening of the stomach raising the possibility of gastritis. No focal ulceration or perforation is identified. 2. Diffuse transmural thickening of the descending colon through rectum likely related to underdistention. Colitis could have this appearance as  well and clinical correlation is therefore recommended. 3. Aortic atherosclerosis. Electronically Signed   By: Ashley Royalty M.D.   On: 03/23/2017 16:31   Dg Chest 2 View  Result Date: 03/23/2017 CLINICAL DATA:  Weakness. EXAM: CHEST  2 VIEW COMPARISON:  Chest x-ray dated March 28, 2016. FINDINGS: The cardiomediastinal silhouette is normal in size. Normal pulmonary vascularity. No focal consolidation, pleural effusion, or pneumothorax. No acute osseous abnormality. IMPRESSION: No active cardiopulmonary disease. Electronically Signed   By: Titus Dubin M.D.   On: 03/23/2017 13:41    Margreat Widener, DO  [facility] Pager [phone]  If 7PM-7AM, please contact night-coverage [email or web address] Password [email or web address] 03/25/2017, 2:52 PM   LOS: 2 days

## 2017-03-25 NOTE — Telephone Encounter (Signed)
1. EGD/TCS W/ MAC WITHIN 1-2 WEEKS. DX: NORMOCYTIC ANEMIA.  NEEDS BIOPSIES TO CONFIRM H PYLORI ERADICATED. CLEAR LIQUIDS ON DAY BEFORE. SUPREP.   2.. NEEDS MRCP Dx: IDIOPATHIC PANCREATITIS IN 2-3 WEEKS.

## 2017-03-25 NOTE — Progress Notes (Signed)
Patient ID: John Carroll, male   DOB: 1960/07/24, 56 y.o.   MRN: [id number]   Assessment/Plan: ADMITTED WITH ACUTE RENAL FAILURE AND CLINICALLY IMPROVED.  PLAN: 1. FULL LIQUID DIET WITH BREEZE TID BM. 2. PT EDUCATED TO DRINK WATER TO KEEP HIS URINE LIGHT YELLOW. 3. EGD/TCS AND MRCP AS AN OUTPT TO ALLOW RENAL FAILURE TO RESOLVE. 4. PROTONIX BID.      Subjective: Since I last evaluated the patient HE IS TOLERATINGPOS. No questions or concerns.   Objective: Vital signs in last 24 hours: Vitals:   03/24/17 1951 03/25/17 0300  BP: (!) 99/56 (!) 99/52  Pulse: 81 79  Resp: 20 18  Temp: 98.2 F (36.8 C) 97.9 F (36.6 C)  SpO2: 100% 97%   General appearance: alert, cooperative and no distress Resp: clear to auscultation bilaterally, FAIR AIR MOVEMENT Cardio: regular rate and rhythm, DISTANT HEART SOUNDS GI: soft, non-tender; bowel sounds normal; no masses,  no organomegaly  Lab Results: FERRITIN 1172 Hb 9.3-10.5 K 4.7 Cr 2.61 Na 142  Studies/Results: No results found.  Medications: I have reviewed the patient's current medications.   LOS: 5 days   Barney Drain 10/11/2013, 2:23 PM

## 2017-03-26 LAB — BASIC METABOLIC PANEL
Anion gap: 6 (ref 5–15)
BUN: 76 mg/dL — ABNORMAL HIGH (ref 6–20)
CALCIUM: 9.4 mg/dL (ref 8.9–10.3)
CO2: 20 mmol/L — ABNORMAL LOW (ref 22–32)
CREATININE: 2.08 mg/dL — AB (ref 0.61–1.24)
Chloride: 121 mmol/L — ABNORMAL HIGH (ref 101–111)
GFR calc non Af Amer: 34 mL/min — ABNORMAL LOW (ref >60)
GFR, EST AFRICAN AMERICAN: 39 mL/min — AB (ref >60)
Glucose, Bld: 95 mg/dL (ref 65–99)
Potassium: 4.6 mmol/L (ref 3.5–5.1)
SODIUM: 147 mmol/L — AB (ref 135–145)

## 2017-03-26 LAB — T4, FREE: FREE T4: 1 ng/dL (ref 0.61–1.12)

## 2017-03-26 MED ORDER — SODIUM CHLORIDE 0.45 % IV SOLN
INTRAVENOUS | Status: DC
  Administered 2017-03-26: 18:00:00 via INTRAVENOUS

## 2017-03-26 NOTE — Progress Notes (Signed)
PROGRESS NOTE  John Carroll ZMO:[id number] DOB: August 06, 1960 DOA: 03/23/2017 PCP: [facility] [place] Brief History: 56 year old male with a history of hypertension, chronic back pain, and tobacco abuse presenting with generalized weakness, weight loss and anorexia.The patient states that he has lost 50 pounds in the last 6 months. He states that he has little desire to eat in part due to his edentulous status. He has had some difficulty obtaining dentures, but he tells me that he has now scheduled to receive his dentures the week after Thanksgiving. Nonetheless, the patient states that "I have just let myself go".He also attributes his poorpointake to having to take care of his mother. He denies any fevers, chills, chest pain, shortness breath, nausea, vomiting, diarrhea, abdominal pain, dysuria, hematuria, hematochezia, melena. He denies any dysphagia or odynophagia. Upon presentation, the patient was afebrile, but hypotensive with a blood pressure of 86/59. Serum creatinine was 6.83 with hemoglobin 9.3 at the time of admission. The patient had a similar presentation when he was admitted from Sep 23, 2016 through Sep 26, 2016. Patient was started on intravenous fluids. GI was consulted secondary to thickening of his stomach on CT imaging.  Assessment/Plan: AKI -Secondary to volume depletion -CT abd--no hydronephrosis -Continue IV fluids-->improving  Failure to thrive/weight loss -TSH--5.515-->check free T4--1.00-->euthyroid sick -Serum B12--710 -Nutrition consult -03/23/17 CT abd--mild gastric fold and mucosal thickening of the stomach transmural;thickening of descending colon to rectum likely due to under distention -GI consult-->full liquids, Boost, endoscopy as outpt. 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 -Tobacco cessation discussed -NicoDerm patch  Hypotension -Patient has history of hypertension -Discontinue lisinopril/HCTZ--patient has not taken for over 1 month -BP overall improved, but remains soft  Hyperkalemia -Given Kayexalate x 1-->improved -Personally reviewed EKG--sinus rhythm, nonspecific T wave change  Hyponatremia -due to poor solute intake and volume depletion -improved with IVF-->now hypernatremic  [place] anemia -Iron saturation 34%, ferritin 1172 -B12--710    Disposition Plan: Home 11/25 if stable  Family Communication:NoFamily at bedside  Consultants:GI  Code Status: FULL   DVT Prophylaxis: [place] Lovenox   Procedures: As Listed in Progress Note Above  Antibiotics: None       Subjective: Patient denies fevers, chills, headache, chest pain, dyspnea, nausea, vomiting, diarrhea, abdominal pain, dysuria, hematuria, hematochezia, and melena.   Objective: Vitals:   03/25/17 1400 03/25/17 2111 03/26/17 0533 03/26/17 0946  BP: 96/65 105/70 96/68   Pulse: 77 73 76   Resp: 20 20 20    Temp: 98.2 F (36.8 C) 98.4 F (36.9 C) 98.7 F (37.1 C)   TempSrc: Oral Oral Oral   SpO2: 100% 100% 99% 98%  Weight:      Height:        Intake/Output Summary (Last 24 hours) at 03/26/2017 1712 Last data filed at 03/26/2017 1200 Gross per 24 hour  Intake 840 ml  Output 725 ml  Net 115 ml   Weight change:  Exam:   General:  Pt is alert, follows commands appropriately, not in acute distress  HEENT: No icterus, No thrush, No neck mass, [place]/AT  Cardiovascular: RRR, S1/S2, no rubs, no gallops  Respiratory: Diminished breath sounds at the bases but clear to auscultation.  No wheezing  Abdomen: Soft/+BS, non tender, non distended, no guarding  Extremities: No edema, No  lymphangitis, No petechiae, No rashes, no synovitis   Data Reviewed: I have  personally reviewed following labs and imaging studies Basic Metabolic Panel: Recent Labs  Lab 03/23/17 1237 03/23/17 2121 03/24/17 0417 03/25/17 0621 03/26/17 0353  NA 132* 134* 138 142 147*  K 5.9* 5.4* 5.6* 4.7 4.6  CL 102 107 109 114* 121*  CO2 16* 17* 19* 19* 20*  GLUCOSE 111* 110* 79 88 95  BUN 221* 188* 168* 118* 76*  CREATININE 6.83* 4.93* 4.19* 2.61* 2.08*  CALCIUM 9.7 8.7* 9.2 8.9 9.4  MG  --   --  2.9*  --   --    Liver Function Tests: Recent Labs  Lab 03/23/17 1237 03/24/17 0417  AST 15 17  ALT 12* 11*  ALKPHOS 61 52  BILITOT 0.9 0.9  PROT 7.6 6.4*  ALBUMIN 4.4 3.7   Recent Labs  Lab 03/23/17 1237  LIPASE 148*   No results for input(s): AMMONIA in the last 168 hours. Coagulation Profile: No results for input(s): INR, PROTIME in the last 168 hours. CBC: Recent Labs  Lab 03/23/17 1237 03/24/17 0417  WBC 7.7 6.3  HGB 10.5* 9.3*  HCT 32.7* 29.2*  MCV 95.6 96.4  PLT 211 202   Cardiac Enzymes: No results for input(s): CKTOTAL, CKMB, CKMBINDEX, TROPONINI in the last 168 hours. BNP: Invalid input(s): POCBNP CBG: Recent Labs  Lab 03/23/17 1300 03/25/17 2142  GLUCAP 103* 114*   HbA1C: No results for input(s): HGBA1C in the last 72 hours. Urine analysis:    Component Value Date/Time   COLORURINE YELLOW 03/23/2017 1228   APPEARANCEUR CLEAR 03/23/2017 1228   LABSPEC 1.013 03/23/2017 1228   PHURINE 5.0 03/23/2017 1228   GLUCOSEU NEGATIVE 03/23/2017 1228   HGBUR NEGATIVE 03/23/2017 1228   BILIRUBINUR NEGATIVE 03/23/2017 1228   KETONESUR NEGATIVE 03/23/2017 1228   PROTEINUR NEGATIVE 03/23/2017 1228   UROBILINOGEN 0.2 12/02/2012 0913   NITRITE NEGATIVE 03/23/2017 1228   LEUKOCYTESUR NEGATIVE 03/23/2017 1228   Sepsis Labs: @LABRCNTIP (procalcitonin:4,lacticidven:4) )No results found for this or any previous visit (from the past 240 hour(s)).   Scheduled Meds: . docusate sodium  100 mg Oral BID  . enoxaparin (LOVENOX) injection  30 mg  Subcutaneous Q24H  . feeding supplement  1 Container Oral TID BM  . nicotine  14 mg Transdermal Daily  . pantoprazole  40 mg Oral BID AC  . sodium chloride flush  3 mL Intravenous Q12H   Continuous Infusions: . sodium chloride 125 mL/hr at 03/26/17 1516    Procedures/Studies: Ct Abdomen Pelvis Wo Contrast  Result Date: 03/23/2017 CLINICAL DATA:  Unspecified abdominal pain. EXAM: CT ABDOMEN AND PELVIS WITHOUT CONTRAST TECHNIQUE: Multidetector CT imaging of the abdomen and pelvis was performed following the standard protocol without IV contrast. COMPARISON:  09/23/2016 FINDINGS: Lower chest: No acute abnormality. Hepatobiliary: No focal liver abnormality is seen. No gallstones, gallbladder wall thickening, or biliary dilatation. Pancreas: Unremarkable. No pancreatic ductal dilatation or surrounding inflammatory changes. Spleen: Normal in size without focal abnormality. Adrenals/Urinary Tract: Adrenal glands are unremarkable. Kidneys are normal, without renal calculi, focal lesion, or hydronephrosis. Bladder is unremarkable. Stomach/Bowel: There is mild gastric fold and mucosal thickening of the stomach raising the possibility of a gastritis. No focal ulceration or perforation is noted. There is normal small bowel rotation without obstruction or inflammation. Normal appendix. There is transmural thickening of the descending colon through rectum likely secondary to underdistention. The possibility of a mild colitis is not excluded. Vascular/Lymphatic: Aortic atherosclerosis. No enlarged abdominal or  pelvic lymph nodes. Reproductive: Prostate is unremarkable. Other: No abdominal wall hernia or abnormality. No abdominopelvic ascites. Musculoskeletal: No acute osseous abnormality. Slight disc space narrowing L2-3. No aggressive lytic or blastic osseous lesions. IMPRESSION: 1. There appears to be mild gastric fold and mucosal thickening of the stomach raising the possibility of gastritis. No focal ulceration  or perforation is identified. 2. Diffuse transmural thickening of the descending colon through rectum likely related to underdistention. Colitis could have this appearance as well and clinical correlation is therefore recommended. 3. Aortic atherosclerosis. Electronically Signed   By: Ashley Royalty M.D.   On: 03/23/2017 16:31   Dg Chest 2 View  Result Date: 03/23/2017 CLINICAL DATA:  Weakness. EXAM: CHEST  2 VIEW COMPARISON:  Chest x-ray dated March 28, 2016. FINDINGS: The cardiomediastinal silhouette is normal in size. Normal pulmonary vascularity. No focal consolidation, pleural effusion, or pneumothorax. No acute osseous abnormality. IMPRESSION: No active cardiopulmonary disease. Electronically Signed   By: Titus Dubin M.D.   On: 03/23/2017 13:41    Yossef Gilkison, DO  [facility] Pager [phone]  If 7PM-7AM, please contact night-coverage [email or web address] Password [email or web address] 03/26/2017, 5:12 PM   LOS: 3 days

## 2017-03-27 DIAGNOSIS — R739 Hyperglycemia, unspecified: Secondary | ICD-10-CM

## 2017-03-27 LAB — BASIC METABOLIC PANEL
Anion gap: 6 (ref 5–15)
BUN: 41 mg/dL — ABNORMAL HIGH (ref 6–20)
CHLORIDE: 122 mmol/L — AB (ref 101–111)
CO2: 18 mmol/L — ABNORMAL LOW (ref 22–32)
CREATININE: 1.72 mg/dL — AB (ref 0.61–1.24)
Calcium: 8.9 mg/dL (ref 8.9–10.3)
GFR, EST AFRICAN AMERICAN: 49 mL/min — AB (ref >60)
GFR, EST NON AFRICAN AMERICAN: 42 mL/min — AB (ref >60)
Glucose, Bld: 82 mg/dL (ref 65–99)
POTASSIUM: 4.7 mmol/L (ref 3.5–5.1)
SODIUM: 146 mmol/L — AB (ref 135–145)

## 2017-03-27 LAB — CBC
HEMATOCRIT: 27.4 % — AB (ref 39.0–52.0)
Hemoglobin: 8.8 g/dL — ABNORMAL LOW (ref 13.0–17.0)
MCH: 31.4 pg (ref 26.0–34.0)
MCHC: 32.1 g/dL (ref 30.0–36.0)
MCV: 97.9 fL (ref 78.0–100.0)
PLATELETS: 176 10*3/uL (ref 150–400)
RBC: 2.8 MIL/uL — AB (ref 4.22–5.81)
RDW: 14.2 % (ref 11.5–15.5)
WBC: 7.4 10*3/uL (ref 4.0–10.5)

## 2017-03-27 MED ORDER — PANTOPRAZOLE SODIUM 40 MG PO TBEC: 40.0000 mg | DELAYED_RELEASE_TABLET | Freq: Two times a day (BID) | ORAL | 1 refills | Status: DC

## 2017-03-27 MED ORDER — BOOST / RESOURCE BREEZE PO LIQD: 1.0000 | Freq: Three times a day (TID) | ORAL | 0 refills | Status: DC

## 2017-03-27 NOTE — Progress Notes (Signed)
John Carroll discharged Home per MD order.  Discharge instructions reviewed and discussed with the patient, all questions and concerns answered. Copy of instructions and scripts given to patient.  Allergies as of 03/27/2017   No Known Allergies     Medication List    TAKE these medications   feeding supplement Liqd Take 1 Container by mouth 3 (three) times daily between meals.   multivitamin with minerals Tabs tablet Take 1 tablet by mouth daily.   pantoprazole 40 MG tablet Commonly known as:  PROTONIX Take 1 tablet (40 mg total) by mouth 2 (two) times daily before a meal.       Patients skin is clean, dry and intact, no evidence of skin break down. IV site discontinued and catheter remains intact. Site without signs and symptoms of complications. Dressing and pressure applied.  Patient escorted to elevator,  no distress noted upon discharge.  John Carroll 03/27/2017 11:32 AM

## 2017-03-27 NOTE — Discharge Summary (Signed)
Physician Discharge Summary  John Carroll:[id number] DOB: 1961/01/11 DOA: 03/23/2017  PCP: Sandria Man[facility] [place] date: 29/52/8413 Discharge date: 03/27/2017  Admitted From: Home Disposition:  Home  Recommendations for Outpatient Follow-up:  1. Follow up with PCP in 1-2 weeks 2. Please obtain BMP/CBC in one week   Discharge Condition: Stable CODE STATUS:FULL Diet recommendation: dysphagia 3   Brief/Interim Summary: 56 year old male with a history of hypertension, chronic back pain, and tobacco abuse presenting with generalized weakness, weight loss and anorexia.The patient states that he has lost 50 pounds in the last 6 months. He states that he has little desire to eat in part due to his edentulous status. He has had some difficulty obtaining dentures, but he tells me that he has now scheduled to receive his dentures the week after Thanksgiving. Nonetheless, the patient states that "I have just let myself go".He also attributes his poorpointake to having to take care of his mother. He denies any fevers, chills, chest pain, shortness breath, nausea, vomiting, diarrhea, abdominal pain, dysuria, hematuria, hematochezia, melena. He denies any dysphagia or odynophagia. Upon presentation, the patient was afebrile, but hypotensive with a blood pressure of 86/59. Serum creatinine was 6.83 with hemoglobin 9.3 at the time of admission. The patient had a similar presentation when he was admitted from Sep 23, 2016 through Sep 26, 2016. Patient was started on intravenous fluids. GI was consulted secondary to thickening of his stomach on CT imaging.     Discharge Diagnoses:  AKI -Secondary to volume depletion -CT abd--no hydronephrosis -Continue IV fluids-->improving -serum creatinine 1.72 on day of d/c  Failure to thrive/weight loss -TSH--5.515-->check free T4--1.00-->euthyroid sick -Serum B12--710 -Nutrition consult -03/23/17 CT abd--mild gastric  fold and mucosal thickening of the stomach transmural;thickening of descending colon to rectum likely due to under distention -GI consult-->full liquids, Boost, endoscopy as outpt. MRCP in 2-3 weeks foridiopathic pancreatitis in May 2018 -pt subsequently wanted dysphagia 3 diet which he tolerated. -Personally reviewed chest x-ray--hyperinflation, no infiltrates -UA neg for pyuria  Tobacco/THC/benzodiazepine abuse -Patient adamantly denies anyBZD use; states he last used THC 6 months ago? -Tobacco cessation discussed -NicoDerm patch  Hypotension -Patient has history of hypertension -Discontinue lisinopril/HCTZ--patient has not taken for over 1 month -BP overall improved, normotenive  Hyperkalemia -GivenKayexalate x 1-->improved -Personally reviewed EKG--sinus rhythm, nonspecific T wave change  Hyponatremia -due to poor solute intake and volume depletion -improved with IVF  NCanemia -Iron saturation 34%, ferritin 1172 -B12--710 -remained stable overall      Discharge Instructions   Allergies as of 03/27/2017   No Known Allergies     Medication List    TAKE these medications   feeding supplement Liqd Take 1 Container by mouth 3 (three) times daily between meals.   multivitamin with minerals Tabs tablet Take 1 tablet by mouth daily.   pantoprazole 40 MG tablet Commonly known as:  PROTONIX Take 1 tablet (40 mg total) by mouth 2 (two) times daily before a meal.       No Known Allergies  Consultations:  GI   Procedures/Studies: Ct Abdomen Pelvis Wo Contrast  Result Date: 03/23/2017 CLINICAL DATA:  Unspecified abdominal pain. EXAM: CT ABDOMEN AND PELVIS WITHOUT CONTRAST TECHNIQUE: Multidetector CT imaging of the abdomen and pelvis was performed following the standard protocol without IV contrast. COMPARISON:  09/23/2016 FINDINGS: Lower chest: No acute abnormality. Hepatobiliary: No focal liver abnormality is seen. No gallstones, gallbladder wall  thickening, or biliary dilatation. Pancreas: Unremarkable. No pancreatic ductal dilatation or surrounding inflammatory  changes. Spleen: Normal in size without focal abnormality. Adrenals/Urinary Tract: Adrenal glands are unremarkable. Kidneys are normal, without renal calculi, focal lesion, or hydronephrosis. Bladder is unremarkable. Stomach/Bowel: There is mild gastric fold and mucosal thickening of the stomach raising the possibility of a gastritis. No focal ulceration or perforation is noted. There is normal small bowel rotation without obstruction or inflammation. Normal appendix. There is transmural thickening of the descending colon through rectum likely secondary to underdistention. The possibility of a mild colitis is not excluded. Vascular/Lymphatic: Aortic atherosclerosis. No enlarged abdominal or pelvic lymph nodes. Reproductive: Prostate is unremarkable. Other: No abdominal wall hernia or abnormality. No abdominopelvic ascites. Musculoskeletal: No acute osseous abnormality. Slight disc space narrowing L2-3. No aggressive lytic or blastic osseous lesions. IMPRESSION: 1. There appears to be mild gastric fold and mucosal thickening of the stomach raising the possibility of gastritis. No focal ulceration or perforation is identified. 2. Diffuse transmural thickening of the descending colon through rectum likely related to underdistention. Colitis could have this appearance as well and clinical correlation is therefore recommended. 3. Aortic atherosclerosis. Electronically Signed   By: Ashley Royalty M.D.   On: 03/23/2017 16:31   Dg Chest 2 View  Result Date: 03/23/2017 CLINICAL DATA:  Weakness. EXAM: CHEST  2 VIEW COMPARISON:  Chest x-ray dated March 28, 2016. FINDINGS: The cardiomediastinal silhouette is normal in size. Normal pulmonary vascularity. No focal consolidation, pleural effusion, or pneumothorax. No acute osseous abnormality. IMPRESSION: No active cardiopulmonary disease. Electronically  Signed   By: Titus Dubin M.D.   On: 03/23/2017 13:41        Discharge Exam: Vitals:   03/26/17 2100 03/27/17 0642  BP: 110/76 122/69  Pulse:  81  Resp: 18 18  Temp: 98.9 F (37.2 C) 98 F (36.7 C)  SpO2: 100% 100%   Vitals:   03/26/17 0533 03/26/17 0946 03/26/17 2100 03/27/17 0642  BP: 96/68  110/76 122/69  Pulse: 76   81  Resp: 20  18 18   Temp: 98.7 F (37.1 C)  98.9 F (37.2 C) 98 F (36.7 C)  TempSrc: Oral  Oral Oral  SpO2: 99% 98% 100% 100%  Weight:      Height:        General: Pt is alert, awake, not in acute distress Cardiovascular: RRR, S1/S2 +, no rubs, no gallops Respiratory: CTA bilaterally, no wheezing, no rhonchi Abdominal: Soft, NT, ND, bowel sounds + Extremities: no edema, no cyanosis   The results of significant diagnostics from this hospitalization (including imaging, microbiology, ancillary and laboratory) are listed below for reference.    Significant Diagnostic Studies: Ct Abdomen Pelvis Wo Contrast  Result Date: 03/23/2017 CLINICAL DATA:  Unspecified abdominal pain. EXAM: CT ABDOMEN AND PELVIS WITHOUT CONTRAST TECHNIQUE: Multidetector CT imaging of the abdomen and pelvis was performed following the standard protocol without IV contrast. COMPARISON:  09/23/2016 FINDINGS: Lower chest: No acute abnormality. Hepatobiliary: No focal liver abnormality is seen. No gallstones, gallbladder wall thickening, or biliary dilatation. Pancreas: Unremarkable. No pancreatic ductal dilatation or surrounding inflammatory changes. Spleen: Normal in size without focal abnormality. Adrenals/Urinary Tract: Adrenal glands are unremarkable. Kidneys are normal, without renal calculi, focal lesion, or hydronephrosis. Bladder is unremarkable. Stomach/Bowel: There is mild gastric fold and mucosal thickening of the stomach raising the possibility of a gastritis. No focal ulceration or perforation is noted. There is normal small bowel rotation without obstruction or  inflammation. Normal appendix. There is transmural thickening of the descending colon through rectum likely secondary to underdistention. The possibility  of a mild colitis is not excluded. Vascular/Lymphatic: Aortic atherosclerosis. No enlarged abdominal or pelvic lymph nodes. Reproductive: Prostate is unremarkable. Other: No abdominal wall hernia or abnormality. No abdominopelvic ascites. Musculoskeletal: No acute osseous abnormality. Slight disc space narrowing L2-3. No aggressive lytic or blastic osseous lesions. IMPRESSION: 1. There appears to be mild gastric fold and mucosal thickening of the stomach raising the possibility of gastritis. No focal ulceration or perforation is identified. 2. Diffuse transmural thickening of the descending colon through rectum likely related to underdistention. Colitis could have this appearance as well and clinical correlation is therefore recommended. 3. Aortic atherosclerosis. Electronically Signed   By: Ashley Royalty M.D.   On: 03/23/2017 16:31   Dg Chest 2 View  Result Date: 03/23/2017 CLINICAL DATA:  Weakness. EXAM: CHEST  2 VIEW COMPARISON:  Chest x-ray dated March 28, 2016. FINDINGS: The cardiomediastinal silhouette is normal in size. Normal pulmonary vascularity. No focal consolidation, pleural effusion, or pneumothorax. No acute osseous abnormality. IMPRESSION: No active cardiopulmonary disease. Electronically Signed   By: Titus Dubin M.D.   On: 03/23/2017 13:41     Microbiology: No results found for this or any previous visit (from the past 240 hour(s)).   Labs: Basic Metabolic Panel: Recent Labs  Lab 03/23/17 2121 03/24/17 0417 03/25/17 0621 03/26/17 0353 03/27/17 0615  NA 134* 138 142 147* 146*  K 5.4* 5.6* 4.7 4.6 4.7  CL 107 109 114* 121* 122*  CO2 17* 19* 19* 20* 18*  GLUCOSE 110* 79 88 95 82  BUN 188* 168* 118* 76* 41*  CREATININE 4.93* 4.19* 2.61* 2.08* 1.72*  CALCIUM 8.7* 9.2 8.9 9.4 8.9  MG  --  2.9*  --   --   --    Liver  Function Tests: Recent Labs  Lab 03/23/17 1237 03/24/17 0417  AST 15 17  ALT 12* 11*  ALKPHOS 61 52  BILITOT 0.9 0.9  PROT 7.6 6.4*  ALBUMIN 4.4 3.7   Recent Labs  Lab 03/23/17 1237  LIPASE 148*   No results for input(s): AMMONIA in the last 168 hours. CBC: Recent Labs  Lab 03/23/17 1237 03/24/17 0417 03/27/17 0615  WBC 7.7 6.3 7.4  HGB 10.5* 9.3* 8.8*  HCT 32.7* 29.2* 27.4*  MCV 95.6 96.4 97.9  PLT 211 202 176   Cardiac Enzymes: No results for input(s): CKTOTAL, CKMB, CKMBINDEX, TROPONINI in the last 168 hours. BNP: Invalid input(s): POCBNP CBG: Recent Labs  Lab 03/23/17 1300 03/25/17 2142  GLUCAP 103* 114*    Time coordinating discharge:  Greater than 30 minutes  Signed:  Renezmae Canlas, DO [facility] Pager: [phone] 03/27/2017, 9:36 AM

## 2017-03-28 ENCOUNTER — Other Ambulatory Visit: Payer: Self-pay

## 2017-03-28 DIAGNOSIS — K859 Acute pancreatitis without necrosis or infection, unspecified: Secondary | ICD-10-CM

## 2017-03-28 NOTE — Telephone Encounter (Signed)
Tried to call pt, went straight to VM. LMOVM for him to call office.

## 2017-03-28 NOTE — Telephone Encounter (Signed)
MRCP scheduled for 04/18/17 at 11:00am, arrive at 10:45am NPO 4 hours before test. Tried to call pt this morning, LMOVM for him to call office.  Holding spot for 04/05/17 for TCS/EGD with Propofol.

## 2017-03-29 ENCOUNTER — Other Ambulatory Visit: Payer: Self-pay

## 2017-03-29 DIAGNOSIS — D649 Anemia, unspecified: Secondary | ICD-10-CM

## 2017-03-29 MED ORDER — NA SULFATE-K SULFATE-MG SULF 17.5-3.13-1.6 GM/177ML PO SOLN: 1.0000 | ORAL | 0 refills | Status: DC

## 2017-03-29 NOTE — Telephone Encounter (Signed)
Pt called office yesterday evening and LMOVM. Called pt this morning. TCS/EGD w/Propofol with SLF scheduled for 04/05/17 at 8:30am. Rx for prep sent to pharmacy. Instructions faxed to pharmacy for him to pick up when he gets med (he is aware). He is also aware of MRCP appt 04/18/17, letter also mailed.  Endo scheduler called and said pt will need pre-op appt. Pre-op scheduled for 03/31/17 at 12:45pm. Called pt back and informed him of pre-op appt. He said he has a dental appt 03/31/17 but unsure of the time. He will call me back after he gets home and checks the time.

## 2017-03-29 NOTE — Telephone Encounter (Signed)
Called pt. He will be able to go to pre-op 03/31/17 at 12:45pm.

## 2017-03-29 NOTE — Telephone Encounter (Signed)
PA info for MRCP submitted via [facility]. Case approved. PA# [id number], 03/29/17-04/28/17.

## 2017-03-30 NOTE — Patient Instructions (Signed)
BUEL MOLDER  03/30/2017     @PREFPERIOPPHARMACY @   Your procedure is scheduled on 04/05/2017.  Report to [facility] at 6:15 A.M.  Call this number if you have problems the morning of surgery:  [phone]   Remember:  Do not eat food or drink liquids after midnight.  Take these medicines the morning of surgery with A SIP OF WATER Protonix   Do not wear jewelry, make-up or nail polish.  Do not wear lotions, powders, or perfumes, or deoderant.  Do not shave 48 hours prior to surgery.  Men may shave face and neck.  Do not bring valuables to the hospital.  [facility] is not responsible for any belongings or valuables.  Contacts, dentures or bridgework may not be worn into surgery.  Leave your suitcase in the car.  After surgery it may be brought to your room.  For patients admitted to the hospital, discharge time will be determined by your treatment team.  Patients discharged the day of surgery will not be allowed to drive home.    Please read over the following fact sheets that you were given. Anesthesia Post-op Instructions     PATIENT INSTRUCTIONS POST-ANESTHESIA  IMMEDIATELY FOLLOWING SURGERY:  Do not drive or operate machinery for the first twenty four hours after surgery.  Do not make any important decisions for twenty four hours after surgery or while taking narcotic pain medications or sedatives.  If you develop intractable nausea and vomiting or a severe headache please notify your doctor immediately.  FOLLOW-UP:  Please make an appointment with your surgeon as instructed. You do not need to follow up with anesthesia unless specifically instructed to do so.  WOUND CARE INSTRUCTIONS (if applicable):  Keep a dry clean dressing on the anesthesia/puncture wound site if there is drainage.  Once the wound has quit draining you may leave it open to air.  Generally you should leave the bandage intact for twenty four hours unless there is drainage.  If the epidural site  drains for more than 36-48 hours please call the anesthesia department.  QUESTIONS?:  Please feel free to call your physician or the hospital operator if you have any questions, and they will be happy to assist you.      Colonoscopy, Adult A colonoscopy is an exam to look at the entire large intestine. During the exam, a lubricated, bendable tube is inserted into the anus and then passed into the rectum, colon, and other parts of the large intestine. A colonoscopy is often done as a part of normal colorectal screening or in response to certain symptoms, such as anemia, persistent diarrhea, abdominal pain, and blood in the stool. The exam can help screen for and diagnose medical problems, including:  Tumors.  Polyps.  Inflammation.  Areas of bleeding.  Tell a health care provider about:  Any allergies you have.  All medicines you are taking, including vitamins, herbs, eye drops, creams, and over-the-counter medicines.  Any problems you or family members have had with anesthetic medicines.  Any blood disorders you have.  Any surgeries you have had.  Any medical conditions you have.  Any problems you have had passing stool. What are the risks? Generally, this is a safe procedure. However, problems may occur, including:  Bleeding.  A tear in the intestine.  A reaction to medicines given during the exam.  Infection (rare).  What happens before the procedure? Eating and drinking restrictions Follow instructions from your health care provider about eating  and drinking, which may include:  A few days before the procedure - follow a low-fiber diet. Avoid nuts, seeds, dried fruit, raw fruits, and vegetables.  1-3 days before the procedure - follow a clear liquid diet. Drink only clear liquids, such as clear broth or bouillon, black coffee or tea, clear juice, clear soft drinks or sports drinks, gelatin dessert, and popsicles. Avoid any liquids that contain red or purple  dye.  On the day of the procedure - do not eat or drink anything during the 2 hours before the procedure, or within the time period that your health care provider recommends.  Bowel prep If you were prescribed an oral bowel prep to clean out your colon:  Take it as told by your health care provider. Starting the day before your procedure, you will need to drink a large amount of medicated liquid. The liquid will cause you to have multiple loose stools until your stool is almost clear or light green.  If your skin or anus gets irritated from diarrhea, you may use these to relieve the irritation: ? Medicated wipes, such as adult wet wipes with aloe and vitamin E. ? A skin soothing-product like petroleum jelly.  If you vomit while drinking the bowel prep, take a break for up to 60 minutes and then begin the bowel prep again. If vomiting continues and you cannot take the bowel prep without vomiting, call your health care provider.  General instructions  Ask your health care provider about changing or stopping your regular medicines. This is especially important if you are taking diabetes medicines or blood thinners.  Plan to have someone take you home from the hospital or clinic. What happens during the procedure?  An IV tube may be inserted into one of your veins.  You will be given medicine to help you relax (sedative).  To reduce your risk of infection: ? Your health care team will wash or sanitize their hands. ? Your anal area will be washed with soap.  You will be asked to lie on your side with your knees bent.  Your health care provider will lubricate a long, thin, flexible tube. The tube will have a camera and a light on the end.  The tube will be inserted into your anus.  The tube will be gently eased through your rectum and colon.  Air will be delivered into your colon to keep it open. You may feel some pressure or cramping.  The camera will be used to take images during  the procedure.  A small tissue sample may be removed from your body to be examined under a microscope (biopsy). If any potential problems are found, the tissue will be sent to a lab for testing.  If small polyps are found, your health care provider may remove them and have them checked for cancer cells.  The tube that was inserted into your anus will be slowly removed. The procedure may vary among health care providers and hospitals. What happens after the procedure?  Your blood pressure, heart rate, breathing rate, and blood oxygen level will be monitored until the medicines you were given have worn off.  Do not drive for 24 hours after the exam.  You may have a small amount of blood in your stool.  You may pass gas and have mild abdominal cramping or bloating due to the air that was used to inflate your colon during the exam.  It is up to you to get the results of  your procedure. Ask your health care provider, or the department performing the procedure, when your results will be ready. This information is not intended to replace advice given to you by your health care provider. Make sure you discuss any questions you have with your health care provider. Document Released: 04/16/2000 Document Revised: 02/18/2016 Document Reviewed: 07/01/2015 Elsevier Interactive Patient Education  2018 [employer]. Esophagogastroduodenoscopy Esophagogastroduodenoscopy (EGD) is a procedure to examine the lining of the esophagus, stomach, and first part of the small intestine (duodenum). This procedure is done to check for problems such as inflammation, bleeding, ulcers, or growths. During this procedure, a long, flexible, lighted tube with a camera attached (endoscope) is inserted down the throat. Tell a health care provider about:  Any allergies you have.  All medicines you are taking, including vitamins, herbs, eye drops, creams, and over-the-counter medicines.  Any problems you or family members  have had with anesthetic medicines.  Any blood disorders you have.  Any surgeries you have had.  Any medical conditions you have.  Whether you are pregnant or may be pregnant. What are the risks? Generally, this is a safe procedure. However, problems may occur, including:  Infection.  Bleeding.  A tear (perforation) in the esophagus, stomach, or duodenum.  Trouble breathing.  Excessive sweating.  Spasms of the larynx.  A slowed heartbeat.  Low blood pressure.  What happens before the procedure?  Follow instructions from your health care provider about eating or drinking restrictions.  Ask your health care provider about: ? Changing or stopping your regular medicines. This is especially important if you are taking diabetes medicines or blood thinners. ? Taking medicines such as aspirin and ibuprofen. These medicines can thin your blood. Do not take these medicines before your procedure if your health care provider instructs you not to.  Plan to have someone take you home after the procedure.  If you wear dentures, be ready to remove them before the procedure. What happens during the procedure?  To reduce your risk of infection, your health care team will wash or sanitize their hands.  An IV tube will be put in a vein in your hand or arm. You will get medicines and fluids through this tube.  You will be given one or more of the following: ? A medicine to help you relax (sedative). ? A medicine to numb the area (local anesthetic). This medicine may be sprayed into your throat. It will make you feel more comfortable and keep you from gagging or coughing during the procedure. ? A medicine for pain.  A mouth guard may be placed in your mouth to protect your teeth and to keep you from biting on the endoscope.  You will be asked to lie on your left side.  The endoscope will be lowered down your throat into your esophagus, stomach, and duodenum.  Air will be put into  the endoscope. This will help your health care provider see better.  The lining of your esophagus, stomach, and duodenum will be examined.  Your health care provider may: ? Take a tissue sample so it can be looked at in a lab (biopsy). ? Remove growths. ? Remove objects (foreign bodies) that are stuck. ? Treat any bleeding with medicines or other devices that stop tissue from bleeding. ? Widen (dilate) or stretch narrowed areas of your esophagus and stomach.  The endoscope will be taken out. The procedure may vary among health care providers and hospitals. What happens after the procedure?  Your  blood pressure, heart rate, breathing rate, and blood oxygen level will be monitored often until the medicines you were given have worn off.  Do not eat or drink anything until the numbing medicine has worn off and your gag reflex has returned. This information is not intended to replace advice given to you by your health care provider. Make sure you discuss any questions you have with your health care provider. Document Released: 08/20/2004 Document Revised: 09/25/2015 Document Reviewed: 03/13/2015 Elsevier Interactive Patient Education  [employer].

## 2017-03-31 ENCOUNTER — Encounter (HOSPITAL_COMMUNITY): Payer: Self-pay

## 2017-03-31 ENCOUNTER — Encounter (HOSPITAL_COMMUNITY)
Admission: RE | Admit: 2017-03-31 | Discharge: 2017-03-31 | Disposition: A | Discharge status: Home / Self Care | Payer: Medicaid Other | Source: Ambulatory Visit | Attending: Gastroenterology | Admitting: Gastroenterology

## 2017-03-31 ENCOUNTER — Other Ambulatory Visit: Payer: Self-pay

## 2017-03-31 DIAGNOSIS — Z01818 Encounter for other preprocedural examination: Secondary | ICD-10-CM | POA: Diagnosis present

## 2017-03-31 DIAGNOSIS — D649 Anemia, unspecified: Secondary | ICD-10-CM | POA: Insufficient documentation

## 2017-03-31 HISTORY — DX: Gastro-esophageal reflux disease without esophagitis: K21.9

## 2017-03-31 LAB — COMPREHENSIVE METABOLIC PANEL
ALK PHOS: 58 U/L (ref 38–126)
ALT: 15 U/L — AB (ref 17–63)
ANION GAP: 6 (ref 5–15)
AST: 20 U/L (ref 15–41)
Albumin: 3.8 g/dL (ref 3.5–5.0)
BILIRUBIN TOTAL: 0.4 mg/dL (ref 0.3–1.2)
BUN: 31 mg/dL — ABNORMAL HIGH (ref 6–20)
CALCIUM: 8.8 mg/dL — AB (ref 8.9–10.3)
CO2: 24 mmol/L (ref 22–32)
CREATININE: 1.58 mg/dL — AB (ref 0.61–1.24)
Chloride: 113 mmol/L — ABNORMAL HIGH (ref 101–111)
GFR calc non Af Amer: 47 mL/min — ABNORMAL LOW (ref >60)
GFR, EST AFRICAN AMERICAN: 54 mL/min — AB (ref >60)
GLUCOSE: 66 mg/dL (ref 65–99)
Potassium: 5 mmol/L (ref 3.5–5.1)
SODIUM: 143 mmol/L (ref 135–145)
TOTAL PROTEIN: 6.6 g/dL (ref 6.5–8.1)

## 2017-03-31 LAB — CBC
HEMATOCRIT: 27.9 % — AB (ref 39.0–52.0)
HEMOGLOBIN: 8.9 g/dL — AB (ref 13.0–17.0)
MCH: 31.4 pg (ref 26.0–34.0)
MCHC: 31.9 g/dL (ref 30.0–36.0)
MCV: 98.6 fL (ref 78.0–100.0)
Platelets: 197 10*3/uL (ref 150–400)
RBC: 2.83 MIL/uL — AB (ref 4.22–5.81)
RDW: 14.7 % (ref 11.5–15.5)
WBC: 7.4 10*3/uL (ref 4.0–10.5)

## 2017-04-05 ENCOUNTER — Ambulatory Visit (HOSPITAL_COMMUNITY)
Admission: RE | Admit: 2017-04-05 | Discharge: 2017-04-05 | Disposition: A | Discharge status: Home / Self Care | Payer: Medicaid Other | Source: Ambulatory Visit | Attending: Gastroenterology | Admitting: Gastroenterology

## 2017-04-05 ENCOUNTER — Encounter (HOSPITAL_COMMUNITY)
Admission: RE | Disposition: A | Discharge status: Home / Self Care | Payer: Self-pay | Source: Ambulatory Visit | Attending: Gastroenterology

## 2017-04-05 ENCOUNTER — Encounter (HOSPITAL_COMMUNITY): Payer: Self-pay

## 2017-04-05 ENCOUNTER — Ambulatory Visit (HOSPITAL_COMMUNITY): Payer: Medicaid Other | Admitting: Anesthesiology

## 2017-04-05 DIAGNOSIS — K2950 Unspecified chronic gastritis without bleeding: Secondary | ICD-10-CM | POA: Insufficient documentation

## 2017-04-05 DIAGNOSIS — K2970 Gastritis, unspecified, without bleeding: Secondary | ICD-10-CM

## 2017-04-05 DIAGNOSIS — N189 Chronic kidney disease, unspecified: Secondary | ICD-10-CM | POA: Insufficient documentation

## 2017-04-05 DIAGNOSIS — K222 Esophageal obstruction: Secondary | ICD-10-CM | POA: Insufficient documentation

## 2017-04-05 DIAGNOSIS — N179 Acute kidney failure, unspecified: Secondary | ICD-10-CM | POA: Diagnosis not present

## 2017-04-05 DIAGNOSIS — D128 Benign neoplasm of rectum: Secondary | ICD-10-CM | POA: Insufficient documentation

## 2017-04-05 DIAGNOSIS — K2980 Duodenitis without bleeding: Secondary | ICD-10-CM | POA: Diagnosis not present

## 2017-04-05 DIAGNOSIS — K219 Gastro-esophageal reflux disease without esophagitis: Secondary | ICD-10-CM | POA: Insufficient documentation

## 2017-04-05 DIAGNOSIS — D649 Anemia, unspecified: Secondary | ICD-10-CM | POA: Insufficient documentation

## 2017-04-05 DIAGNOSIS — D120 Benign neoplasm of cecum: Secondary | ICD-10-CM | POA: Diagnosis not present

## 2017-04-05 DIAGNOSIS — K648 Other hemorrhoids: Secondary | ICD-10-CM | POA: Diagnosis not present

## 2017-04-05 DIAGNOSIS — K5730 Diverticulosis of large intestine without perforation or abscess without bleeding: Secondary | ICD-10-CM | POA: Insufficient documentation

## 2017-04-05 DIAGNOSIS — I129 Hypertensive chronic kidney disease with stage 1 through stage 4 chronic kidney disease, or unspecified chronic kidney disease: Secondary | ICD-10-CM | POA: Diagnosis not present

## 2017-04-05 DIAGNOSIS — F17210 Nicotine dependence, cigarettes, uncomplicated: Secondary | ICD-10-CM | POA: Diagnosis not present

## 2017-04-05 DIAGNOSIS — D123 Benign neoplasm of transverse colon: Secondary | ICD-10-CM | POA: Insufficient documentation

## 2017-04-05 DIAGNOSIS — D125 Benign neoplasm of sigmoid colon: Secondary | ICD-10-CM | POA: Diagnosis not present

## 2017-04-05 DIAGNOSIS — Z79899 Other long term (current) drug therapy: Secondary | ICD-10-CM | POA: Diagnosis not present

## 2017-04-05 DIAGNOSIS — K449 Diaphragmatic hernia without obstruction or gangrene: Secondary | ICD-10-CM | POA: Insufficient documentation

## 2017-04-05 HISTORY — PX: ESOPHAGOGASTRODUODENOSCOPY (EGD) WITH PROPOFOL: SHX5813

## 2017-04-05 HISTORY — PX: POLYPECTOMY: SHX5525

## 2017-04-05 HISTORY — PX: COLONOSCOPY WITH PROPOFOL: SHX5780

## 2017-04-05 HISTORY — PX: BIOPSY: SHX5522

## 2017-04-05 SURGERY — COLONOSCOPY WITH PROPOFOL
Anesthesia: Monitor Anesthesia Care

## 2017-04-05 MED ORDER — SODIUM CHLORIDE 0.9% FLUSH
INTRAVENOUS | Status: AC
  Filled 2017-04-05: qty 10

## 2017-04-05 MED ORDER — PROPOFOL 500 MG/50ML IV EMUL
INTRAVENOUS | Status: DC | PRN
  Administered 2017-04-05: 150 ug/kg/min via INTRAVENOUS
  Administered 2017-04-05 (×2): via INTRAVENOUS

## 2017-04-05 MED ORDER — CHLORHEXIDINE GLUCONATE CLOTH 2 % EX PADS: 6.0000 | MEDICATED_PAD | Freq: Once | CUTANEOUS | Status: DC

## 2017-04-05 MED ORDER — LIDOCAINE VISCOUS 2 % MT SOLN
OROMUCOSAL | Status: AC
  Filled 2017-04-05: qty 15

## 2017-04-05 MED ORDER — LIDOCAINE VISCOUS 2 % MT SOLN
15.0000 mL | Freq: Once | OROMUCOSAL | Status: AC
  Administered 2017-04-05: 15 mL via OROMUCOSAL

## 2017-04-05 MED ORDER — PROPOFOL 10 MG/ML IV BOLUS
INTRAVENOUS | Status: AC
  Filled 2017-04-05: qty 20

## 2017-04-05 MED ORDER — LIDOCAINE HCL (PF) 1 % IJ SOLN
INTRAMUSCULAR | Status: AC
  Filled 2017-04-05: qty 20

## 2017-04-05 MED ORDER — PROPOFOL 10 MG/ML IV BOLUS
INTRAVENOUS | Status: AC
  Filled 2017-04-05: qty 40

## 2017-04-05 MED ORDER — PROPOFOL 10 MG/ML IV BOLUS
INTRAVENOUS | Status: DC | PRN
Start: 2017-04-05 — End: 2017-04-05
  Administered 2017-04-05: 10 mg via INTRAVENOUS
  Administered 2017-04-05 (×2): 20 mg via INTRAVENOUS

## 2017-04-05 MED ORDER — SPOT INK MARKER SYRINGE KIT
PACK | SUBMUCOSAL | Status: DC | PRN
  Administered 2017-04-05: .75 mL via SUBMUCOSAL

## 2017-04-05 MED ORDER — MINERAL OIL PO OIL
TOPICAL_OIL | ORAL | Status: AC
  Filled 2017-04-05: qty 30

## 2017-04-05 MED ORDER — LACTATED RINGERS IV SOLN
INTRAVENOUS | Status: DC
  Administered 2017-04-05: 07:00:00 via INTRAVENOUS

## 2017-04-05 MED ORDER — MIDAZOLAM HCL 2 MG/2ML IJ SOLN
1.0000 mg | Freq: Once | INTRAMUSCULAR | Status: AC | PRN
  Administered 2017-04-05: 2 mg via INTRAVENOUS
  Filled 2017-04-05: qty 2

## 2017-04-05 MED ORDER — SODIUM CHLORIDE 0.9 % IV SOLN: INTRAVENOUS | Status: DC

## 2017-04-05 MED ORDER — SPOT INK MARKER SYRINGE KIT
PACK | SUBMUCOSAL | Status: AC
  Filled 2017-04-05: qty 5

## 2017-04-05 NOTE — Transfer of Care (Signed)
Immediate Anesthesia Transfer of Care Note  Patient: John Carroll  Procedure(s) Performed: COLONOSCOPY WITH PROPOFOL (N/A ) ESOPHAGOGASTRODUODENOSCOPY (EGD) WITH PROPOFOL (N/A ) POLYPECTOMY BIOPSY  Patient Location: PACU  Anesthesia Type:MAC  Level of Consciousness: awake, alert  and oriented  Airway & Oxygen Therapy: Patient Spontanous Breathing  Post-op Assessment: Report given to RN  Post vital signs: Reviewed  Last Vitals:  Vitals:   04/05/17 0700 04/05/17 0957  BP: 114/67 101/63  Pulse:  67  Resp: 11 11  Temp:  36.5 C  SpO2: 96% 100%    Last Pain:  Vitals:   04/05/17 0644  TempSrc: Oral         Complications: No apparent anesthesia complications

## 2017-04-05 NOTE — Anesthesia Postprocedure Evaluation (Signed)
Anesthesia Post Note  Patient: John Carroll  Procedure(s) Performed: COLONOSCOPY WITH PROPOFOL (N/A ) ESOPHAGOGASTRODUODENOSCOPY (EGD) WITH PROPOFOL (N/A ) POLYPECTOMY BIOPSY  Patient location during evaluation: Short Stay Anesthesia Type: MAC Level of consciousness: awake and alert and patient cooperative Pain management: satisfactory to patient Vital Signs Assessment: post-procedure vital signs reviewed and stable Respiratory status: spontaneous breathing Cardiovascular status: stable Postop Assessment: no apparent nausea or vomiting Anesthetic complications: no     Last Vitals:  Vitals:   04/05/17 1010 04/05/17 1016  BP:  128/80  Pulse: 70 76  Resp: 11 15  Temp:  36.6 C  SpO2: 100% 100%    Last Pain:  Vitals:   04/05/17 1016  TempSrc: Oral                 Admir Candelas

## 2017-04-05 NOTE — Anesthesia Preprocedure Evaluation (Signed)
Anesthesia Evaluation  Patient identified by MRN, date of birth, ID band Patient awake    Airway Mallampati: I  TM Distance: >3 FB Neck ROM: Full    Dental  (+) Edentulous Upper,    Pulmonary Current Smoker,    Pulmonary exam normal        Cardiovascular hypertension, Normal cardiovascular exam Rhythm:Regular Rate:Normal     Neuro/Psych    GI/Hepatic GERD  Medicated and Controlled,  Endo/Other    Renal/GU Renal InsufficiencyRenal diseaseResults for JVON, MERONEY (MRN [id number]) as of 04/05/2017 07:05  03/31/2017 13:12 Sodium: 143 Potassium: 5.0 Chloride: 113 (H) CO2: 24 Glucose: 66 BUN: 31 (H) Creatinine: 1.58 (H)      Musculoskeletal   Abdominal Normal abdominal exam  (+)   Peds  Hematology Results for NAZARIO, RUSSOM (MRN [id number]) as of 04/05/2017 07:05  03/31/2017 13:12 WBC: 7.4 RBC: 2.83 (L) Hemoglobin: 8.9 (L) HCT: 27.9 (L) MCV: 98.6 MCH: 31.4 MCHC: 31.9 RDW: 14.7 Platelets: 197    Anesthesia Other Findings   Reproductive/Obstetrics                             Anesthesia Physical Anesthesia Plan  ASA: III  Anesthesia Plan: MAC   Post-op Pain Management:    Induction:   PONV Risk Score and Plan:   Airway Management Planned: Nasal Cannula  Additional Equipment:   Intra-op Plan:   Post-operative Plan:   Informed Consent: I have reviewed the patients History and Physical, chart, labs and discussed the procedure including the risks, benefits and alternatives for the proposed anesthesia with the patient or authorized representative who has indicated his/her understanding and acceptance.   Dental advisory given and Consent reviewed with POA  Plan Discussed with:   Anesthesia Plan Comments:         Anesthesia Quick Evaluation

## 2017-04-05 NOTE — Op Note (Signed)
[facility] Patient Name: John Carroll Procedure Date: 04/05/2017 8:17 AM MRN: [id number] Date of Birth: Jul 03, 1960 Attending MD: Barney Drain MD, MD CSN: [id number] Age: 56 Admit Type: Outpatient Procedure:                Colonoscopy WITH COLD SNARE/SNARE CAUTERY                            POLYPECTOMY Indications:              Anemia Providers:                Barney Drain MD, MD, Otis Peak B. Sharon Seller, RN, Nelma Rothman, Technician, Randa Spike, Technician Referring MD:              Medicines:                Propofol per Anesthesia Complications:            No immediate complications. Estimated Blood Loss:     Estimated blood loss was minimal. Procedure:                Pre-Anesthesia Assessment:                           - Prior to the procedure, a History and Physical                            was performed, and patient medications and                            allergies were reviewed. The patient's tolerance of                            previous anesthesia was also reviewed. The risks                            and benefits of the procedure and the sedation                            options and risks were discussed with the patient.                            All questions were answered, and informed consent                            was obtained. Prior Anticoagulants: The patient has                            taken no previous anticoagulant or antiplatelet                            agents. ASA Grade Assessment: II - A patient with  mild systemic disease. After reviewing the risks                            and benefits, the patient was deemed in                            satisfactory condition to undergo the procedure.                            After obtaining informed consent, the colonoscope                            was passed under direct vision. Throughout the                            procedure, the  patient's blood pressure, pulse, and                            oxygen saturations were monitored continuously. The                            EC-[id number] ([id number]) scope was introduced through                            the anus and advanced to the 3 cm into the ileum.                            The colonoscopy was technically difficult and                            complex due to restricted mobility of the colon and                            significant looping. Successful completion of the                            procedure was aided by increasing the dose of                            sedation medication, straightening and shortening                            the scope to obtain bowel loop reduction and                            COLOWRAP. The patient tolerated the procedure well.                            The terminal ileum, ileocecal valve, appendiceal                            orifice, and rectum were photographed. The quality  of the bowel preparation was excellent. The                            terminal ileum, ileocecal valve, appendiceal                            orifice, and rectum were photographed. Scope In: 9:06:32 AM Scope Out: 9:32:40 AM Scope Withdrawal Time: 0 hours 22 minutes 5 seconds  Total Procedure Duration: 0 hours 26 minutes 8 seconds  Findings:      The terminal ileum appeared normal.      A 12 mm polyp was found in the sigmoid colon. The polyp was       semi-pedunculated. Area was tattooed with an injection of 1 mL of [place]       ink. The polyp was removed with a hot snare. Resection and retrieval       were complete.      Five sessile polyps were found in the sigmoid colon, proximal transverse       colon and cecum. The polyps were 6 to 10 mm in size. These polyps were       removed with a hot snare. Resection and retrieval were complete.      Two sessile polyps were found in the sigmoid colon and hepatic flexure.       The  polyps were 2 to 4 mm in size. These polyps were removed with a cold       snare. Resection and retrieval were complete.      Multiple small and large-mouthed diverticula were found in the       recto-sigmoid colon, sigmoid colon and cecum.      Internal hemorrhoids were found during retroflexion. The hemorrhoids       were moderate.      The recto-sigmoid colon and sigmoid colon were mildly redundant. Impression:               - The examined portion of the ileum was normal.                           - One 12 mm polyp in the sigmoid colon, removed                            with a hot snare. Resected and retrieved. Tattooed.                           - Five 6 TO 10 mm polyps in the sigmoid colon(2),                            in the proximal transverse colon(2) and in the                            cecum, removed with a hot snare. Resected and                            retrieved.                           - Two 2 to 4 mm  polyps in the sigmoid colon and at                            the hepatic flexure, removed with a cold snare.                            Resected and retrieved.                           - MODERATE Diverticulosis in the recto-sigmoid                            colon, in the sigmoid colon and in the cecum.                           - Internal hemorrhoids.                           - Redundant RECTOSIGMOD COLON. Moderate Sedation:      Per Anesthesia Care Recommendation:           - High fiber diet.                           - Continue present medications.                           - Await pathology results.                           - Repeat colonoscopy 1-3 YEARS for surveillance.                           - Return to GI office in 6 months.                           - Patient has a contact number available for                            emergencies. The signs and symptoms of potential                            delayed complications were discussed with the                             patient. Return to normal activities tomorrow.                            Written discharge instructions were provided to the                            patient. Procedure Code(s):        --- Professional ---                           [phone], Colonoscopy, flexible; with removal of  tumor(s), polyp(s), or other lesion(s) by snare                            technique                           45381, Colonoscopy, flexible; with directed                            submucosal injection(s), any substance Diagnosis Code(s):        --- Professional ---                           K64.8, Other hemorrhoids                           D12.5, Benign neoplasm of sigmoid colon                           D12.0, Benign neoplasm of cecum                           D12.3, Benign neoplasm of transverse colon (hepatic                            flexure or splenic flexure)                           D64.9, Anemia, unspecified                           K57.30, Diverticulosis of large intestine without                            perforation or abscess without bleeding                           Q43.8, Other specified congenital malformations of                            intestine CPT copyright 2016 [employer]. All rights reserved. The codes documented in this report are preliminary and upon coder review may  be revised to meet current compliance requirements. Barney Drain, MD Barney Drain MD, MD 04/05/2017 9:55:39 AM This report has been signed electronically. Number of Addenda: 0

## 2017-04-05 NOTE — Op Note (Signed)
[facility] Patient Name: John Carroll Procedure Date: 04/05/2017 9:34 AM MRN: [id number] Date of Birth: [birth year] Attending MD: Barney Drain MD, MD CSN: [id number] Age: 56 Admit Type: Outpatient Procedure:                Upper GI endoscopy WITH COLD FORCEPS BIOPSY Indications:              Anemia Providers:                Barney Drain MD, MD, Gwenlyn Fudge RN, RN, Nelma Rothman, Technician, Randa Spike, Technician Referring MD:              Medicines:                Propofol per Anesthesia Complications:            No immediate complications. Estimated Blood Loss:     Estimated blood loss was minimal. Procedure:                Pre-Anesthesia Assessment:                           - Prior to the procedure, a History and Physical                            was performed, and patient medications and                            allergies were reviewed. The patient's tolerance of                            previous anesthesia was also reviewed. The risks                            and benefits of the procedure and the sedation                            options and risks were discussed with the patient.                            All questions were answered, and informed consent                            was obtained. Prior Anticoagulants: The patient has                            taken no previous anticoagulant or antiplatelet                            agents. ASA Grade Assessment: II - A patient with                            mild systemic disease. After reviewing the risks  and benefits, the patient was deemed in                            satisfactory condition to undergo the procedure.                            After obtaining informed consent, the endoscope was                            passed under direct vision. Throughout the                            procedure, the patient's blood pressure, pulse, and                oxygen saturations were monitored continuously. The                            EG-[id number] ([id number]) scope was introduced through the                            mouth, and advanced to the second part of duodenum.                            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 The patient tolerated                            the procedure well. Scope In: 9:39:51 AM Scope Out: 9:49:11 AM Total Procedure Duration: 0 hours 9 minutes 20 seconds  Findings:      A moderate Schatzki ring (acquired) was found at the gastroesophageal       junction.      A medium-sized hiatal hernia was present.      Diffuse moderate inflammation characterized by congestion (edema) and       erythema was found in the entire examined stomach. Biopsies were taken       with a cold forceps for Helicobacter pylori testing.      Patchy mild inflammation characterized by congestion (edema) and       erythema was found in the duodenal bulb. Biopsies(2) for histology were       taken with a cold forceps for evaluation of celiac disease.      The second portion of the duodenum was normal. Biopsies(4) for histology       were taken with a cold forceps for evaluation of celiac disease. Impression:               - Moderate Schatzki ring.                           - Medium-sized hiatal hernia.                           -  ANEMIA DUE TO POLYPS AND GASTRODUODENITIS Moderate Sedation:      Per Anesthesia Care Recommendation:           - Resume previous diet.                           - Continue present medications. PROTNIX DAILY.                           - Await pathology results.                           - Return to my office in 6 months.                           - Patient has a contact number available for                            emergencies. The signs and symptoms of  potential                            delayed complications were discussed with the                            patient. Return to normal activities tomorrow.                            Written discharge instructions were provided to the                            patient. Procedure Code(s):        --- Professional ---                           [phone], Esophagogastroduodenoscopy, flexible,                            transoral; with biopsy, single or multiple Diagnosis Code(s):        --- Professional ---                           K22.2, Esophageal obstruction                           K44.9, Diaphragmatic hernia without obstruction or                            gangrene                           K29.70, Gastritis, unspecified, without bleeding                           K29.80, Duodenitis without bleeding                           D64.9, Anemia, unspecified CPT copyright 2016 [employer]. All rights reserved. The codes  documented in this report are preliminary and upon coder review may  be revised to meet current compliance requirements. Barney Drain, MD Barney Drain MD, MD 04/05/2017 9:59:31 AM This report has been signed electronically. Number of Addenda: 0

## 2017-04-05 NOTE — Interval H&P Note (Signed)
History and Physical Interval Note:  04/05/2017 8:47 AM  John Carroll  has presented today for surgery, with the diagnosis of normocytic anemia  The various methods of treatment have been discussed with the patient and family. After consideration of risks, benefits and other options for treatment, the patient has consented to  Procedure(s) with comments: COLONOSCOPY WITH PROPOFOL (N/A) - 8:30am ESOPHAGOGASTRODUODENOSCOPY (EGD) WITH PROPOFOL (N/A) as a surgical intervention .  The patient's history has been reviewed, patient examined, no change in status, stable for surgery.  I have reviewed the patient's chart and labs.  Questions were answered to the patient's satisfaction.     [employer]

## 2017-04-05 NOTE — Discharge Instructions (Signed)
You have MODERATE internal hemorrhoids, and HAD 8 polyps removed. You have A RING AT THE BASE OF YOUR ESOPHAGUS, A MODERATE SIZE HIATAL HERNIA, & gastritis/DUODENITIS. I biopsied your stomach AND SMALL BOWEL.   DRINK WATER TO KEEP YOUR URINE LIGHT YELLOW.  FOLLOW A HIGH FIBER/LOW FAT DIET. AVOID ITEMS THAT CAUSE BLOATING. SEE INFO BELOW.  YOUR BIOPSY RESULTS WILL BE AVAILABLE IN MY CHART AFTER DEC 8 AND MY OFFICE WILL CONTACT YOU IN 10-14 DAYS WITH YOUR RESULTS.   CONTINUE PROTONIX. TAKE 30 MINUTES PRIOR TO MEALS TWICE DAILY.  REPEAT CBC IN 3 MOS.  FOLLOW UP IN 6 MOS W/ DR. Avien Taha.   Next colonoscopy in 1-3 years. YOUR SISTERS, BROTHERS, CHILDREN, AND PARENTS NEED TO HAVE A COLONOSCOPY STARTING AT THE AGE OF 40.     ENDOSCOPY Care After Read the instructions outlined below and refer to this sheet in the next week. These discharge instructions provide you with general information on caring for yourself after you leave the hospital. While your treatment has been planned according to the most current medical practices available, unavoidable complications occasionally occur. If you have any problems or questions after discharge, call DR. Shondale Quinley, [phone].  ACTIVITY  You may resume your regular activity, but move at a slower pace for the next 24 hours.   Take frequent rest periods for the next 24 hours.   Walking will help get rid of the air and reduce the bloated feeling in your belly (abdomen).   No driving for 24 hours (because of the medicine (anesthesia) used during the test).   You may shower.   Do not sign any important legal documents or operate any machinery for 24 hours (because of the anesthesia used during the test).    NUTRITION  Drink plenty of fluids.   You may resume your normal diet as instructed by your doctor.   Begin with a light meal and progress to your normal diet. Heavy or fried foods are harder to digest and may make you feel sick to your stomach  (nauseated).   Avoid alcoholic beverages for 24 hours or as instructed.    MEDICATIONS  You may resume your normal medications.   WHAT YOU CAN EXPECT TODAY  Some feelings of bloating in the abdomen.   Passage of more gas than usual.   Spotting of blood in your stool or on the toilet paper  .  IF YOU HAD POLYPS REMOVED DURING THE ENDOSCOPY:  Eat a soft diet IF YOU HAVE NAUSEA, BLOATING, ABDOMINAL PAIN, OR VOMITING.    FINDING OUT THE RESULTS OF YOUR TEST Not all test results are available during your visit. DR. Oneida Alar WILL CALL YOU WITHIN 14 DAYS OF YOUR PROCEDUE WITH YOUR RESULTS. Do not assume everything is normal if you have not heard from DR. Eniola Cerullo, CALL HER OFFICE AT [phone].  SEEK IMMEDIATE MEDICAL ATTENTION AND CALL THE OFFICE: [phone] IF:  You have more than a spotting of blood in your stool.   Your belly is swollen (abdominal distention).   You are nauseated or vomiting.   You have a temperature over 101F.   You have abdominal pain or discomfort that is severe or gets worse throughout the day.  Polyps, Colon  A polyp is extra tissue that grows inside your body. Colon polyps grow in the large intestine. The large intestine, also called the colon, is part of your digestive system. It is a long, hollow tube at the end of your digestive tract where your  body makes and stores stool. Most polyps are not dangerous. They are benign. This means they are not cancerous. But over time, some types of polyps can turn into cancer. Polyps that are smaller than a pea are usually not harmful. But larger polyps could someday become or may already be cancerous. To be safe, doctors remove all polyps and test them.     PREVENTION There is not one sure way to prevent polyps. You might be able to lower your risk of getting them if you:  Eat more fruits and vegetables and less fatty food.   Do not smoke.   Avoid alcohol.   Exercise every day.   Lose weight if you are  overweight.   Eating more calcium and folate can also lower your risk of getting polyps. Some foods that are rich in calcium are milk, cheese, and broccoli. Some foods that are rich in folate are chickpeas, kidney beans, and spinach.    Gastritis  Gastritis is an inflammation (the body's way of reacting to injury and/or infection) of the stomach. It is often caused by viral or bacterial (germ) infections. It can also be caused BY ASPIRIN, BC/GOODY POWDER'S, (IBUPROFEN) MOTRIN, OR ALEVE (NAPROXEN), chemicals (including alcohol), SPICY FOODS, and medications. This illness may be associated with generalized malaise (feeling tired, not well), UPPER ABDOMINAL STOMACH cramps, and fever. One common bacterial cause of gastritis is an organism known as H. Pylori. This can be treated with antibiotics.    High-Fiber Diet A high-fiber diet changes your normal diet to include more whole grains, legumes, fruits, and vegetables. Changes in the diet involve replacing refined carbohydrates with unrefined foods. The calorie level of the diet is essentially unchanged. The Dietary Reference Intake (recommended amount) for adult males is 38 grams per day. For adult females, it is 25 grams per day. Pregnant and lactating women should consume 28 grams of fiber per day. Fiber is the intact part of a plant that is not broken down during digestion. Functional fiber is fiber that has been isolated from the plant to provide a beneficial effect in the body. PURPOSE  Increase stool bulk.   Ease and regulate bowel movements.   Lower cholesterol.   REDUCE RISK OF COLON CANCER  INDICATIONS THAT YOU NEED MORE FIBER  Constipation and hemorrhoids.   Uncomplicated diverticulosis (intestine condition) and irritable bowel syndrome.   Weight management.   As a protective measure against hardening of the arteries (atherosclerosis), diabetes, and cancer.   GUIDELINES FOR INCREASING FIBER IN THE DIET  Start adding fiber to  the diet slowly. A gradual increase of about 5 more grams (2 slices of whole-wheat bread, 2 servings of most fruits or vegetables, or 1 bowl of high-fiber cereal) per day is best. Too rapid an increase in fiber may result in constipation, flatulence, and bloating.   Drink enough water and fluids to keep your urine clear or pale yellow. Water, juice, or caffeine-free drinks are recommended. Not drinking enough fluid may cause constipation.   Eat a variety of high-fiber foods rather than one type of fiber.   Try to increase your intake of fiber through using high-fiber foods rather than fiber pills or supplements that contain small amounts of fiber.   The goal is to change the types of food eaten. Do not supplement your present diet with high-fiber foods, but replace foods in your present diet.   INCLUDE A VARIETY OF FIBER SOURCES  Replace refined and processed grains with whole grains, canned fruits  with fresh fruits, and incorporate other fiber sources. White rice, white breads, and most bakery goods contain little or no fiber.   Brown whole-grain rice, buckwheat oats, and many fruits and vegetables are all good sources of fiber. These include: broccoli, Brussels sprouts, cabbage, cauliflower, beets, sweet potatoes, white potatoes (skin on), carrots, tomatoes, eggplant, squash, berries, fresh fruits, and dried fruits.   Cereals appear to be the richest source of fiber. Cereal fiber is found in whole grains and bran. Bran is the fiber-rich outer coat of cereal grain, which is largely removed in refining. In whole-grain cereals, the bran remains. In breakfast cereals, the largest amount of fiber is found in those with "bran" in their names. The fiber content is sometimes indicated on the label.   You may need to include additional fruits and vegetables each day.   In baking, for 1 cup white flour, you may use the following substitutions:   1 cup whole-wheat flour minus 2 tablespoons.   1/2 cup  white flour plus 1/2 cup whole-wheat flour.

## 2017-04-06 ENCOUNTER — Encounter (HOSPITAL_COMMUNITY): Payer: Self-pay | Admitting: Gastroenterology

## 2017-04-07 ENCOUNTER — Telehealth: Payer: Self-pay | Admitting: Gastroenterology

## 2017-04-07 NOTE — Telephone Encounter (Signed)
Please call pt. HE had SEVEN simple adenomas AND ONE HYPERPLASTIC POLYP removed from HIS colon.  His stomach AND SMALL BOWEL BIOPSIES SHOW gastritis AND DUODENITIS.     DRINK WATER TO KEEP YOUR URINE LIGHT YELLOW.  FOLLOW A HIGH FIBER/LOW FAT DIET. AVOID ITEMS THAT CAUSE BLOATING.   CONTINUE PROTONIX. TAKE 30 MINUTES PRIOR TO MEALS TWICE DAILY FOR 3 MOS THEN ONCE DAILY INDEFINITELY.  REPEAT CBC IN 3 MOS.  FOLLOW UP IN 6 MOS W/ DR. FIELDS.   Next colonoscopy in 3 years W/ MAC. YOUR SISTERS, BROTHERS, CHILDREN, AND PARENTS NEED TO HAVE A COLONOSCOPY STARTING AT THE AGE OF 40.

## 2017-04-07 NOTE — Telephone Encounter (Signed)
REMINDER IN EPIC °

## 2017-04-07 NOTE — Telephone Encounter (Signed)
Tried to call, phone would not even connect. Mailing a letter for pt to call for results.

## 2017-04-13 NOTE — Telephone Encounter (Signed)
Pt had left Vm to call and I called and had to leave a Vm to return call for results.

## 2017-04-18 ENCOUNTER — Ambulatory Visit (HOSPITAL_COMMUNITY): Admission: RE | Admit: 2017-04-18 | Payer: Medicaid Other | Source: Ambulatory Visit

## 2017-04-19 NOTE — Telephone Encounter (Signed)
PT left Vm that his phone has been messed up and I can call [phone] to get him now. I called and got Vm and left message for a return call.

## 2017-04-20 ENCOUNTER — Telehealth: Payer: Self-pay | Admitting: Gastroenterology

## 2017-04-20 NOTE — Telephone Encounter (Signed)
LMOM to call.

## 2017-04-20 NOTE — Telephone Encounter (Signed)
Patient called to see if his tcs results were in, please call [phone]

## 2017-04-21 ENCOUNTER — Other Ambulatory Visit: Payer: Self-pay

## 2017-04-21 DIAGNOSIS — D649 Anemia, unspecified: Secondary | ICD-10-CM

## 2017-04-21 NOTE — Telephone Encounter (Signed)
Pt is aware of his colonoscopy results and lab orders on due for March 2019.

## 2017-04-28 ENCOUNTER — Other Ambulatory Visit: Payer: Self-pay | Admitting: Gastroenterology

## 2017-04-28 ENCOUNTER — Ambulatory Visit (HOSPITAL_COMMUNITY)
Admission: RE | Admit: 2017-04-28 | Discharge: 2017-04-28 | Disposition: A | Discharge status: Home / Self Care | Payer: Medicaid Other | Source: Ambulatory Visit | Attending: Gastroenterology | Admitting: Gastroenterology

## 2017-04-28 DIAGNOSIS — K859 Acute pancreatitis without necrosis or infection, unspecified: Secondary | ICD-10-CM | POA: Insufficient documentation

## 2017-04-28 DIAGNOSIS — R188 Other ascites: Secondary | ICD-10-CM | POA: Diagnosis not present

## 2017-04-28 MED ORDER — GADOBENATE DIMEGLUMINE 529 MG/ML IV SOLN
6.0000 mL | Freq: Once | INTRAVENOUS | Status: AC | PRN
  Administered 2017-04-28: 6 mL via INTRAVENOUS

## 2017-05-02 ENCOUNTER — Telehealth: Payer: Self-pay | Admitting: Gastroenterology

## 2017-05-02 NOTE — Telephone Encounter (Signed)
PLEASE CALL PT. HIS MRCP SHOWS A NORMAL PANCREAS, BILE DUCT, AND GALLBLADDER. NO REASON FOR HIS EPISODE OF PANCREATITIS IN MAY 2018 WAS IDENTIFIED. THE MOST LIKELY REASON FOR HIS PANCREATITIS IS GB SLUDGE(THICK SECRETION) PASSED INTO HIS BILE DUCT AND BLOCKED THE PANCREAS DUCT CAUSING PANCREATITIS.  HE HAS TWO OPTIONS AT THIS POINT: 1. IF HE HAS ANOTHER EPISODE OF PANCREATITIS, HE SHOULD AVE HIS GB REMOVED OR 2. MOVE FORWARD WITH HAVING HIS GB REMOVED.

## 2017-05-04 NOTE — Telephone Encounter (Signed)
PT is aware and would like to wait until he has another episode.

## 2017-05-04 NOTE — Telephone Encounter (Signed)
LMOM for a return call.  

## 2017-05-04 NOTE — Telephone Encounter (Signed)
REVIEWED-NO ADDITIONAL RECOMMENDATIONS. 

## 2017-05-25 ENCOUNTER — Telehealth: Payer: Self-pay | Admitting: Gastroenterology

## 2017-05-25 NOTE — Telephone Encounter (Signed)
Lab order on file for 07/20/2017.

## 2017-05-25 NOTE — Telephone Encounter (Signed)
RECALL FOR CBC

## 2017-06-10 ENCOUNTER — Other Ambulatory Visit: Payer: Self-pay | Admitting: *Deleted

## 2017-06-10 ENCOUNTER — Encounter: Payer: Self-pay | Admitting: *Deleted

## 2017-06-10 DIAGNOSIS — D649 Anemia, unspecified: Secondary | ICD-10-CM

## 2017-08-25 ENCOUNTER — Encounter: Payer: Self-pay | Admitting: Gastroenterology

## 2018-03-08 ENCOUNTER — Emergency Department (HOSPITAL_COMMUNITY): Payer: Medicaid Other

## 2018-03-08 ENCOUNTER — Encounter (HOSPITAL_COMMUNITY): Payer: Self-pay | Admitting: Emergency Medicine

## 2018-03-08 ENCOUNTER — Other Ambulatory Visit: Payer: Self-pay

## 2018-03-08 ENCOUNTER — Emergency Department (HOSPITAL_COMMUNITY)
Admission: EM | Admit: 2018-03-08 | Discharge: 2018-03-08 | Disposition: A | Discharge status: Home / Self Care | Payer: Medicaid Other | Attending: Emergency Medicine | Admitting: Emergency Medicine

## 2018-03-08 DIAGNOSIS — F17210 Nicotine dependence, cigarettes, uncomplicated: Secondary | ICD-10-CM | POA: Diagnosis not present

## 2018-03-08 DIAGNOSIS — M79671 Pain in right foot: Secondary | ICD-10-CM | POA: Diagnosis not present

## 2018-03-08 DIAGNOSIS — I10 Essential (primary) hypertension: Secondary | ICD-10-CM | POA: Diagnosis not present

## 2018-03-08 DIAGNOSIS — Z79899 Other long term (current) drug therapy: Secondary | ICD-10-CM | POA: Insufficient documentation

## 2018-03-08 MED ORDER — NAPROXEN 500 MG PO TABS: 500.0000 mg | ORAL_TABLET | Freq: Two times a day (BID) | ORAL | 0 refills | Status: DC

## 2018-03-08 NOTE — Discharge Instructions (Addendum)
You were evaluated today for foot pain. This is likely musculoskeletal in nature. I have prescribed you naproxen.  Please take as prescribed.  This medicine may make you sleepy.  I would suggest you elevate, ice and rest your foot.  Please follow-up with your primary care provider in 2 days if you have continued pain.  Return to the ED for any new or worsening symptoms

## 2018-03-08 NOTE — ED Provider Notes (Signed)
[facility] EMERGENCY DEPARTMENT Provider Note   CSN: [id number] Arrival date & time: 03/08/18  1130   History   Chief Complaint Chief Complaint  Patient presents with  . Foot Pain    HPI John Carroll is a 57 y.o. male with a past medical history significant for hypertension and chronic back pain who presents for evaluation of right foot pain.  Patient states he has been standing a lot on his feet chopping wood over the last week.  Patient states approximately 3 days ago when he was done chopping wood he took issues off and noticed his right foot was swollen.  States it was painful to walk on at the time.  Denies injury or trauma to right foot.  Patient states he has been treating his foot with ice and elevation.  States that the swelling is almost completely gone, however his daughter looked at his foot and told him he may have gout and he needs to be evaluated.  States he does have mild pain when he walks.  Rates his pain a 3/10.  Denies radiation of pain.  Denies redness or warmth to his right lower extremity.  Denies calf pain or calf swelling.  Denies aggravating actors.  States ice and elevation resolves his symptoms.  Has not taken anything for his pain.  Has been able to ambulate without difficulty.  Denies fever, chills, nausea, vomiting, numbness/tingling, decreased range of motion his lower extremities, redness, ecchymosis or warmth to lower extremity.  History obtained from patient.  No interpreter was used.  HPI  Past Medical History:  Diagnosis Date  . Acute renal failure (ARF) ([place]) 5/18, 11/18  . Chronic back pain   . Epididymitis   . GERD (gastroesophageal reflux disease)   . Hypertension    stopped medications due to low BPs  . Pancreatitis 08/2016    Patient Active Problem List   Diagnosis Date Noted  . Normocytic anemia   . Failure to thrive in adult 03/24/2017  . Hypotension due to hypovolemia   . Unexplained weight loss 03/23/2017  . Pancreatitis  09/23/2016  . Marijuana abuse 09/23/2016  . Tobacco abuse 09/23/2016  . Hyperglycemia 09/23/2016  . AKI (acute kidney injury) ([place]) 03/29/2016  . Acute renal failure (ARF) ([place]) 03/28/2016  . Hyperkalemia 03/28/2016  . Hypertension 03/28/2016  . Hyponatremia 03/28/2016    Past Surgical History:  Procedure Laterality Date  . BIOPSY  04/05/2017   Procedure: BIOPSY;  Surgeon: Danie Binder, MD;  [facility];  Service: Endoscopy;;  duodenum  . COLONOSCOPY WITH PROPOFOL N/A 04/05/2017   Procedure: COLONOSCOPY WITH PROPOFOL;  Surgeon: Danie Binder, MD;  Location: [facility];  Service: Endoscopy;  Laterality: N/A;  8:30am  . ESOPHAGOGASTRODUODENOSCOPY (EGD) WITH PROPOFOL N/A 04/05/2017   Procedure: ESOPHAGOGASTRODUODENOSCOPY (EGD) WITH PROPOFOL;  Surgeon: Danie Binder, MD;  Location: [facility];  Service: Endoscopy;  Laterality: N/A;  . POLYPECTOMY  04/05/2017   Procedure: POLYPECTOMY;  Surgeon: Danie Binder, MD;  Location: [facility];  Service: Endoscopy;;  colon        Home Medications    Prior to Admission medications   Medication Sig Start Date End Date Taking? Authorizing Provider  feeding supplement (BOOST / RESOURCE BREEZE) LIQD Take 1 Container by mouth 3 (three) times daily between meals. 03/27/17   Orson Eva, MD  Multiple Vitamin (MULTIVITAMIN WITH MINERALS) TABS tablet Take 1 tablet by mouth daily.    [provider]  naproxen (  NAPROSYN) 500 MG tablet Take 1 tablet (500 mg total) by mouth 2 (two) times daily. 03/08/18   Taci Sterling A, PA-C  pantoprazole (PROTONIX) 40 MG tablet Take 1 tablet (40 mg total) by mouth 2 (two) times daily before a meal. 03/27/17   Orson Eva, MD    Family History Family History  Problem Relation Age of Onset  . Kidney disease Mother   . Hypertension Mother   . Diabetes Mellitus II Mother   . Heart attack Father 35       Died of MI in 66  . Heart attack Sister   . Hypertension Sister     Social  History Social History   Tobacco Use  . Smoking status: Current Every Day Smoker    Packs/day: 1.00    Years: 27.00    Pack years: 27.00    Types: Cigarettes  . Smokeless tobacco: Never Used  Substance Use Topics  . Alcohol use: Yes    Comment: occasionally  . Drug use: Yes    Types: Marijuana    Comment: occasional use, last use maybe a couple of weeks ago     Allergies   Patient has no known allergies.   Review of Systems Review of Systems  Constitutional: Negative.   Cardiovascular: Negative for leg swelling.  Gastrointestinal: Negative.   Musculoskeletal:       Right foot pain and swelling.  Skin: Negative.   Neurological: Negative.   All other systems reviewed and are negative.    Physical Exam Updated Vital Signs BP (!) 156/89 (BP Location: Right Arm)   Pulse 97   Temp 97.9 F (36.6 C) (Oral)   Resp 20   Ht 5\' 8"  (1.727 m)   Wt 70.3 kg   SpO2 97%   BMI 23.57 kg/m   Physical Exam  Constitutional: He appears well-developed and well-nourished. No distress.  HENT:  Head: Atraumatic.  Eyes: Pupils are equal, round, and reactive to light.  Neck: Normal range of motion. Neck supple.  Cardiovascular: Normal rate and regular rhythm.  Pulmonary/Chest: Effort normal. No respiratory distress.  Abdominal: Soft. He exhibits no distension.  Musculoskeletal: Normal range of motion.  Full range of motion bilateral lower extremity with flexion and extension as well as plantar flexion, dorsiflexion, inversion and eversion.  Patient is able to ambulate without difficulty.  Mild tenderness palpation to lateral plantar surface of right foot.  No calcaneus tenderness.  No tenderness over navicular or metatarsals.  No evidence of obvious deformity of bilateral lower extremity.  No calf tenderness or calf swelling.  No tenderness to medial or lateral malleolus. No Tibia-fibula tenderness.  Lower extremity compartments are soft.  Neurological: He is alert.  Intact sensation  to sharp and dull.  Skin: Skin is warm and dry. He is not diaphoretic.  No edema, erythema, ecchymosis or warmth to bilateral lower extremity. No rashes or lesions.  No evidence of skin breakdown or insect bites.  Psychiatric: He has a normal mood and affect.  Nursing note and vitals reviewed.    ED Treatments / Results  Labs (all labs ordered are listed, but only abnormal results are displayed) Labs Reviewed - No data to display  EKG None  Radiology Dg Foot Complete Right  Result Date: 03/08/2018 CLINICAL DATA:  Right foot pain and redness. EXAM: RIGHT FOOT COMPLETE - 3+ VIEW COMPARISON:  No prior. FINDINGS: No acute bony or joint abnormality identified. No evidence of fracture or dislocation. Soft tissues are unremarkable. IMPRESSION: No acute  abnormality identified. Electronically Signed   By: Marcello Moores  Register   On: 03/08/2018 12:16    Procedures Procedures (including critical care time)  Medications Ordered in ED Medications - No data to display   Initial Impression / Assessment and Plan / ED Course  I have reviewed the triage vital signs and the nursing notes.  Pertinent labs & imaging results that were available during my care of the patient were reviewed by me and considered in my medical decision making (see chart for details).  57 year old male who appears otherwise well presents for evaluation of right foot pain.  Afebrile, nonseptic, non-ill-appearing.  No known injury or recent trauma.  Patient states he has been outside chopping wood over the last week.  States his foot was "extremely swollen" approximately 3 days ago which has resolved.  Presents today because his daughter told him he "might have gout."  Normal musculoskeletal exam.  Neurovascularly intact.  No bony tenderness palpation.  There is tenderness to the lateral plantar surface on right foot.  Lower extremity compartments are soft.  No calf swelling or calf tenderness.  No evidence of edema, erythema,  ecchymosis or warmth. No evidence of skin breakdown or insect bites.  Patient most likely with musculoskeletal sprain or strain.  Plain film negative for fracture or dislocation.  Able to ambulate department without difficulty.  Low suspicion for septic joint, gout, hemarthrosis or other emergenct pathology causing patient's pain.  Discussed with patient symptomatic management.  Discussed follow-up with PCP for reevaluation if he has continued pain.  Discussed reasons to return to the emergency department.  Patient voices understanding and is agreeable for follow-up.  Patient is hemodynamically stable and appropriate for discharge at this time.    Final Clinical Impressions(s) / ED Diagnoses   Final diagnoses:  Foot pain, right    ED Discharge Orders         Ordered    naproxen (NAPROSYN) 500 MG tablet  2 times daily     03/08/18 1307           Dacia Capers A, PA-C 03/08/18 1323    Fredia Sorrow, MD 03/10/18 1252

## 2018-03-08 NOTE — ED Triage Notes (Signed)
Patient complaining of right foot pain after cutting wood outside for 2 days. Denies any other injury.

## 2019-03-27 ENCOUNTER — Other Ambulatory Visit: Payer: Self-pay | Admitting: *Deleted

## 2019-03-27 DIAGNOSIS — Z20822 Contact with and (suspected) exposure to covid-19: Secondary | ICD-10-CM

## 2019-03-28 LAB — NOVEL CORONAVIRUS, NAA: SARS-CoV-2, NAA: NOT DETECTED

## 2019-03-30 ENCOUNTER — Telehealth: Payer: Self-pay | Admitting: *Deleted

## 2019-03-30 NOTE — Telephone Encounter (Signed)
Patient called and was given negative covid results . 

## 2019-04-30 ENCOUNTER — Emergency Department (HOSPITAL_COMMUNITY)
Admission: EM | Admit: 2019-04-30 | Discharge: 2019-04-30 | Disposition: A | Discharge status: Home / Self Care | Payer: Medicaid Other | Attending: Emergency Medicine | Admitting: Emergency Medicine

## 2019-04-30 ENCOUNTER — Emergency Department (HOSPITAL_COMMUNITY): Payer: Medicaid Other

## 2019-04-30 ENCOUNTER — Other Ambulatory Visit: Payer: Self-pay

## 2019-04-30 ENCOUNTER — Encounter (HOSPITAL_COMMUNITY): Payer: Self-pay | Admitting: Emergency Medicine

## 2019-04-30 DIAGNOSIS — R1031 Right lower quadrant pain: Secondary | ICD-10-CM | POA: Insufficient documentation

## 2019-04-30 DIAGNOSIS — R109 Unspecified abdominal pain: Secondary | ICD-10-CM | POA: Diagnosis not present

## 2019-04-30 DIAGNOSIS — E876 Hypokalemia: Secondary | ICD-10-CM | POA: Insufficient documentation

## 2019-04-30 DIAGNOSIS — F17210 Nicotine dependence, cigarettes, uncomplicated: Secondary | ICD-10-CM | POA: Insufficient documentation

## 2019-04-30 DIAGNOSIS — M545 Low back pain: Secondary | ICD-10-CM | POA: Diagnosis present

## 2019-04-30 DIAGNOSIS — I10 Essential (primary) hypertension: Secondary | ICD-10-CM | POA: Diagnosis not present

## 2019-04-30 DIAGNOSIS — R918 Other nonspecific abnormal finding of lung field: Secondary | ICD-10-CM | POA: Diagnosis not present

## 2019-04-30 DIAGNOSIS — Z20828 Contact with and (suspected) exposure to other viral communicable diseases: Secondary | ICD-10-CM | POA: Insufficient documentation

## 2019-04-30 DIAGNOSIS — Z20822 Contact with and (suspected) exposure to covid-19: Secondary | ICD-10-CM

## 2019-04-30 DIAGNOSIS — Z03818 Encounter for observation for suspected exposure to other biological agents ruled out: Secondary | ICD-10-CM | POA: Diagnosis not present

## 2019-04-30 DIAGNOSIS — K852 Alcohol induced acute pancreatitis without necrosis or infection: Secondary | ICD-10-CM | POA: Insufficient documentation

## 2019-04-30 LAB — LIPASE, BLOOD: Lipase: 110 U/L — ABNORMAL HIGH (ref 11–51)

## 2019-04-30 LAB — CBC WITH DIFFERENTIAL/PLATELET
Abs Immature Granulocytes: 0.04 10*3/uL (ref 0.00–0.07)
Basophils Absolute: 0 10*3/uL (ref 0.0–0.1)
Basophils Relative: 0 %
Eosinophils Absolute: 0.1 10*3/uL (ref 0.0–0.5)
Eosinophils Relative: 0 %
HCT: 44.9 % (ref 39.0–52.0)
Hemoglobin: 16 g/dL (ref 13.0–17.0)
Immature Granulocytes: 0 %
Lymphocytes Relative: 17 %
Lymphs Abs: 2 10*3/uL (ref 0.7–4.0)
MCH: 34.8 pg — ABNORMAL HIGH (ref 26.0–34.0)
MCHC: 35.6 g/dL (ref 30.0–36.0)
MCV: 97.6 fL (ref 80.0–100.0)
Monocytes Absolute: 1 10*3/uL (ref 0.1–1.0)
Monocytes Relative: 9 %
Neutro Abs: 8.8 10*3/uL — ABNORMAL HIGH (ref 1.7–7.7)
Neutrophils Relative %: 74 %
Platelets: 157 10*3/uL (ref 150–400)
RBC: 4.6 MIL/uL (ref 4.22–5.81)
RDW: 12.7 % (ref 11.5–15.5)
WBC: 12.1 10*3/uL — ABNORMAL HIGH (ref 4.0–10.5)
nRBC: 0 % (ref 0.0–0.2)

## 2019-04-30 LAB — COMPREHENSIVE METABOLIC PANEL
ALT: 25 U/L (ref 0–44)
AST: 35 U/L (ref 15–41)
Albumin: 3.8 g/dL (ref 3.5–5.0)
Alkaline Phosphatase: 124 U/L (ref 38–126)
Anion gap: 15 (ref 5–15)
BUN: 20 mg/dL (ref 6–20)
CO2: 30 mmol/L (ref 22–32)
Calcium: 9.2 mg/dL (ref 8.9–10.3)
Chloride: 91 mmol/L — ABNORMAL LOW (ref 98–111)
Creatinine, Ser: 1.75 mg/dL — ABNORMAL HIGH (ref 0.61–1.24)
GFR calc Af Amer: 48 mL/min — ABNORMAL LOW (ref >60)
GFR calc non Af Amer: 41 mL/min — ABNORMAL LOW (ref >60)
Glucose, Bld: 122 mg/dL — ABNORMAL HIGH (ref 70–99)
Potassium: 3 mmol/L — ABNORMAL LOW (ref 3.5–5.1)
Sodium: 136 mmol/L (ref 135–145)
Total Bilirubin: 1.5 mg/dL — ABNORMAL HIGH (ref 0.3–1.2)
Total Protein: 7.3 g/dL (ref 6.5–8.1)

## 2019-04-30 LAB — URINALYSIS, ROUTINE W REFLEX MICROSCOPIC
Bacteria, UA: NONE SEEN
Bilirubin Urine: NEGATIVE
Glucose, UA: NEGATIVE mg/dL
Hgb urine dipstick: NEGATIVE
Ketones, ur: NEGATIVE mg/dL
Leukocytes,Ua: NEGATIVE
Nitrite: NEGATIVE
Protein, ur: 30 mg/dL — AB
Specific Gravity, Urine: 1.014 (ref 1.005–1.030)
pH: 5 (ref 5.0–8.0)

## 2019-04-30 MED ORDER — SODIUM CHLORIDE 0.9 % IV BOLUS
1000.0000 mL | Freq: Once | INTRAVENOUS | Status: AC
  Administered 2019-04-30: 1000 mL via INTRAVENOUS

## 2019-04-30 MED ORDER — IOHEXOL 300 MG/ML  SOLN
80.0000 mL | Freq: Once | INTRAMUSCULAR | Status: AC | PRN
  Administered 2019-04-30: 80 mL via INTRAVENOUS

## 2019-04-30 MED ORDER — MORPHINE SULFATE (PF) 4 MG/ML IV SOLN
4.0000 mg | Freq: Once | INTRAVENOUS | Status: AC
  Administered 2019-04-30: 4 mg via INTRAVENOUS
  Filled 2019-04-30: qty 1

## 2019-04-30 MED ORDER — KETOROLAC TROMETHAMINE 60 MG/2ML IM SOLN: 30.0000 mg | Freq: Once | INTRAMUSCULAR | Status: DC

## 2019-04-30 MED ORDER — POTASSIUM CHLORIDE ER 10 MEQ PO TBCR: 10.0000 meq | EXTENDED_RELEASE_TABLET | Freq: Every day | ORAL | 0 refills | Status: DC

## 2019-04-30 MED ORDER — MORPHINE SULFATE (PF) 4 MG/ML IV SOLN: 4.0000 mg | Freq: Once | INTRAVENOUS | Status: DC

## 2019-04-30 MED ORDER — HYDROCODONE-ACETAMINOPHEN 5-325 MG PO TABS: 1.0000 | ORAL_TABLET | ORAL | 0 refills | Status: DC | PRN

## 2019-04-30 MED ORDER — POTASSIUM CHLORIDE CRYS ER 20 MEQ PO TBCR
40.0000 meq | EXTENDED_RELEASE_TABLET | Freq: Once | ORAL | Status: AC
  Administered 2019-04-30: 40 meq via ORAL
  Filled 2019-04-30: qty 2

## 2019-04-30 NOTE — ED Triage Notes (Signed)
Pt reports during triage that he was in floor on Christmas morning playing with grand-kids.

## 2019-04-30 NOTE — ED Provider Notes (Signed)
[facility] EMERGENCY DEPARTMENT Provider Note   CSN: [id number] Arrival date & time: 04/30/19  1108     History Chief Complaint  Patient presents with  . Back Pain    2 days    DRENNAN Carroll is a 58 y.o. male with PMHx GERD, chronic back pain, and alcoholic pancreatitis who presents to the ED today complaining of gradual onset, constant, achy, right flank pain x 2 days.  Patient also complaining of right-sided abdominal pain.  He reports that he was roughhousing with his grandchildren during Christmas and initially thought that this was due to his back pain but now is not so convinced given he is having abdominal pain as well.  He states that he has been taking Advil with mild relief.  He does report history of pancreatitis and states this feels somewhat similar.  Patient does report that with the global pandemic he recently started drinking again.  He does endorse that he was sober for quite some time.  Patient approximates about 1/5 a pint of liquor daily but stopped drinking approximately 5 days ago as he noticed that it was becoming a problem for him.  He is denying palpitations, dizziness, lightheadedness, tremors, visual/auditory hallucinations, nausea, vomiting, any other associated symptoms.    The history is provided by the patient.       Past Medical History:  Diagnosis Date  . Acute renal failure (ARF) ([place]) 5/18, 11/18  . Chronic back pain   . Epididymitis   . GERD (gastroesophageal reflux disease)   . Hypertension    stopped medications due to low BPs  . Pancreatitis 08/2016    Patient Active Problem List   Diagnosis Date Noted  . Normocytic anemia   . Failure to thrive in adult 03/24/2017  . Hypotension due to hypovolemia   . Unexplained weight loss 03/23/2017  . Pancreatitis 09/23/2016  . Marijuana abuse 09/23/2016  . Tobacco abuse 09/23/2016  . Hyperglycemia 09/23/2016  . AKI (acute kidney injury) ([place]) 03/29/2016  . Acute renal failure (ARF) ([place])  03/28/2016  . Hyperkalemia 03/28/2016  . Hypertension 03/28/2016  . Hyponatremia 03/28/2016    Past Surgical History:  Procedure Laterality Date  . BIOPSY  04/05/2017   Procedure: BIOPSY;  Surgeon: Danie Binder, MD;  [facility];  Service: Endoscopy;;  duodenum  . COLONOSCOPY WITH PROPOFOL N/A 04/05/2017   Procedure: COLONOSCOPY WITH PROPOFOL;  Surgeon: Danie Binder, MD;  [facility];  Service: Endoscopy;  Laterality: N/A;  8:30am  . ESOPHAGOGASTRODUODENOSCOPY (EGD) WITH PROPOFOL N/A 04/05/2017   Procedure: ESOPHAGOGASTRODUODENOSCOPY (EGD) WITH PROPOFOL;  Surgeon: Danie Binder, MD;  [facility];  Service: Endoscopy;  Laterality: N/A;  . POLYPECTOMY  04/05/2017   Procedure: POLYPECTOMY;  Surgeon: Danie Binder, MD;  [facility];  Service: Endoscopy;;  colon       Family History  Problem Relation Age of Onset  . Kidney disease Mother   . Hypertension Mother   . Diabetes Mellitus II Mother   . Heart attack Father 53       Died of MI in 8  . Heart attack Sister   . Hypertension Sister     Social History   Tobacco Use  . Smoking status: Current Every Day Smoker    Packs/day: 1.00    Years: 27.00    Pack years: 27.00    Types: Cigarettes  . Smokeless tobacco: Never Used  Substance Use Topics  . Alcohol use:  Yes    Comment: occasionally  . Drug use: Yes    Types: Marijuana    Comment: occasional use, last use maybe a couple of weeks ago    Home Medications Prior to Admission medications   Medication Sig Start Date End Date Taking? Authorizing Provider  feeding supplement (BOOST / RESOURCE BREEZE) LIQD Take 1 Container by mouth 3 (three) times daily between meals. 03/27/17   Orson Eva, MD  HYDROcodone-acetaminophen (NORCO/VICODIN) 5-325 MG tablet Take 1 tablet by mouth every 4 (four) hours as needed for severe pain. 04/30/19   Eustaquio Maize, PA-C  Multiple Vitamin (MULTIVITAMIN WITH MINERALS) TABS tablet Take 1  tablet by mouth daily.    [provider]  naproxen (NAPROSYN) 500 MG tablet Take 1 tablet (500 mg total) by mouth 2 (two) times daily. 03/08/18   Henderly, Britni A, PA-C  pantoprazole (PROTONIX) 40 MG tablet Take 1 tablet (40 mg total) by mouth 2 (two) times daily before a meal. 03/27/17   Tat, Shanon Brow, MD  potassium chloride (KLOR-CON) 10 MEQ tablet Take 1 tablet (10 mEq total) by mouth daily for 5 days. 04/30/19 05/05/19  Eustaquio Maize, PA-C    Allergies    Patient has no known allergies.  Review of Systems   Review of Systems  Constitutional: Negative for chills and fever.  HENT: Negative for congestion.   Eyes: Negative for visual disturbance.  Respiratory: Negative for cough and shortness of breath.   Cardiovascular: Negative for chest pain.  Gastrointestinal: Positive for abdominal pain and constipation. Negative for diarrhea, nausea and vomiting.  Genitourinary: Positive for flank pain.  Musculoskeletal: Negative for myalgias.  Skin: Negative for rash.  Neurological: Negative for tremors and headaches.  Psychiatric/Behavioral: Negative for hallucinations.    Physical Exam Updated Vital Signs BP (!) 125/94 (BP Location: Right Arm)   Pulse 95   Temp 98.8 F (37.1 C) (Oral)   Resp 16   Ht 5\' 8"  (1.727 m)   Wt 70.3 kg   SpO2 95%   BMI 23.57 kg/m   Physical Exam Vitals and nursing note reviewed.  Constitutional:      Appearance: He is not ill-appearing or diaphoretic.  HENT:     Head: Normocephalic and atraumatic.  Eyes:     Conjunctiva/sclera: Conjunctivae normal.  Cardiovascular:     Rate and Rhythm: Normal rate and regular rhythm.  Pulmonary:     Effort: Pulmonary effort is normal.     Breath sounds: Normal breath sounds. No wheezing or rhonchi.  Abdominal:     Palpations: Abdomen is soft.     Tenderness: There is abdominal tenderness in the right upper quadrant and periumbilical area. There is right CVA tenderness. There is no guarding or rebound.    Musculoskeletal:     Cervical back: Neck supple.  Skin:    General: Skin is warm and dry.  Neurological:     Mental Status: He is alert.     ED Results / Procedures / Treatments   Labs (all labs ordered are listed, but only abnormal results are displayed) Labs Reviewed  URINALYSIS, ROUTINE W REFLEX MICROSCOPIC - Abnormal; Notable for the following components:      Result Value   Protein, ur 30 (*)    All other components within normal limits  COMPREHENSIVE METABOLIC PANEL - Abnormal; Notable for the following components:   Potassium 3.0 (*)    Chloride 91 (*)    Glucose, Bld 122 (*)    Creatinine, Ser 1.75 (*)  Total Bilirubin 1.5 (*)    GFR calc non Af Amer 41 (*)    GFR calc Af Amer 48 (*)    All other components within normal limits  LIPASE, BLOOD - Abnormal; Notable for the following components:   Lipase 110 (*)    All other components within normal limits  CBC WITH DIFFERENTIAL/PLATELET - Abnormal; Notable for the following components:   WBC 12.1 (*)    MCH 34.8 (*)    Neutro Abs 8.8 (*)    All other components within normal limits  NOVEL CORONAVIRUS, NAA (HOSP ORDER, SEND-OUT TO REF LAB; TAT 18-24 HRS)    EKG None  Radiology DG Chest 2 View  Result Date: 04/30/2019 CLINICAL DATA:  Infection seen on CT EXAM: CHEST - 2 VIEW COMPARISON:  Same-day CT abdomen pelvis FINDINGS: The heart size and mediastinal contours are within normal limits. Both lungs are clear. The visualized skeletal structures are unremarkable. IMPRESSION: No radiographic evidence of infection. Subtle ground-glass opacities in the dependent left lung base seen on prior CT likely too subtle to appreciate radiographically. Electronically Signed   By: Eddie Candle M.D.   On: 04/30/2019 19:29   CT Abdomen Pelvis W Contrast  Result Date: 04/30/2019 CLINICAL DATA:  RIGHT flank pain for several days. History of pancreatitis. EXAM: CT ABDOMEN AND PELVIS WITH CONTRAST TECHNIQUE: Multidetector CT  imaging of the abdomen and pelvis was performed using the standard protocol following bolus administration of intravenous contrast. CONTRAST:  48mL OMNIPAQUE IOHEXOL 300 MG/ML  SOLN COMPARISON:  CT 03/23/2017 FINDINGS: Lower chest: Mild peribronchial thickening in the medial LEFT lower lobe. Hepatobiliary: No focal hepatic lesion. No biliary duct dilatation. Gallbladder is normal. Common bile duct is normal. Pancreas: There is mild hazy inflammation in the retroperitoneal fat adjacent to the head of the pancreas. No dilatation of the common bile duct. No dilatation of the pancreatic duct. The body and tail the pancreas are normal. Spleen: Normal spleen Adrenals/urinary tract: Adrenal glands and kidneys are normal. The ureters and bladder normal. Stomach/Bowel: Stomach, small bowel, appendix, and cecum are normal. The colon and rectosigmoid colon are normal. Vascular/Lymphatic: Abdominal aorta is normal caliber with atherosclerotic calcification. There is no retroperitoneal or periportal lymphadenopathy. No pelvic lymphadenopathy. Reproductive: Prostate normal Other: No free fluid. Musculoskeletal: No aggressive osseous lesion. IMPRESSION: 1. Mild inflammation of the pancreatic head consistent with focal pancreatitis. 2. No biliary or pancreatic duct dilatation. 3. No cholelithiasis evident by CT imaging. 4. Mild inflammation or infection in the medial LEFT lower lobe of the lung. Electronically Signed   By: Suzy Bouchard M.D.   On: 04/30/2019 18:40    Procedures Procedures (including critical care time)  Medications Ordered in ED Medications  sodium chloride 0.9 % bolus 1,000 mL (0 mLs Intravenous Stopped 04/30/19 1854)  potassium chloride SA (KLOR-CON) CR tablet 40 mEq (40 mEq Oral Given 04/30/19 1650)  morphine 4 MG/ML injection 4 mg (4 mg Intravenous Given 04/30/19 1651)  iohexol (OMNIPAQUE) 300 MG/ML solution 80 mL (80 mLs Intravenous Contrast Given 04/30/19 1806)    ED Course  I have reviewed  the triage vital signs and the nursing notes.  Pertinent labs & imaging results that were available during my care of the patient were reviewed by me and considered in my medical decision making (see chart for details).  58 year old male presents to the ED today initially complaining of right flank pain although reports he has also diffuse abdominal pain for the past 2 days.  History of pancreatitis.  Does report he recently stopped drinking approximately 5 days ago.  He appears comfortable on exam.  He is nondiaphoretic.  He is nontachycardic.  He does not have any complaints of tremors, palpitations, hallucinations.  Doubt that he is going into withdrawals at this time.  Patient is tender diffusely to abdomen as well as right CVA tenderness.  She has patient having symptoms related to kidney stone.  Urinalysis without infection or signs of blood.  She does have history of chronic back pain and question if he is having some pain related to this that is unrelated to his abdominal pain.  Will work-up with screening labs today as well as CT abdomen and pelvis.  Morphine given for pain, fluids given for comfort.  BMP with mild hypokalemia at 3.0.  Creatinine stable at 1.75 from patient's baseline.  No other electrolyte abnormalities today.  No elevation in LFTs.  Do not feel patient needs imaging of his gallbladder today.  CBC with mild leukocytosis of 12,000.  Question if this is related due to pain versus infection.  Lipase mildly elevated at 110 although this appears much improved from patient's previous.   Lab Results  Component Value Date   CREATININE 1.75 (H) 04/30/2019   CREATININE 1.58 (H) 03/31/2017   CREATININE 1.72 (H) 03/27/2017   CT scan with findings of pancreatitis.  They did catch questionable infection in the left lower lobe.  Will obtain chest x-ray at this time.  X-ray with findings of groundglass opacities.  Will swab with send out test for Covid at this time.  Patient advised to  self isolate until he receives his results.  If positive he will need to stay home and self isolate for 14 days starting today.  Have given work note for same.  Will prescribe a short course of Norco as needed for his abdominal pain consistent with pancreatitis today.  Patient advised to increase fluid intake as well.  Advised to take potassium supplements and follow-up with his PCP in 1 to 2 weeks for recheck of his potassium level.  He is in agreement with plan at this time.  All questions have been answered in a timely manner.  Stable for discharge.  This note was prepared using Dragon voice recognition software and may include unintentional dictation errors due to the inherent limitations of voice recognition software.  John Carroll was evaluated in Emergency Department on 04/30/2019 for the symptoms described in the history of present illness. He was evaluated in the context of the global COVID-19 pandemic, which necessitated consideration that the patient might be at risk for infection with the SARS-CoV-2 virus that causes COVID-19. Institutional protocols and algorithms that pertain to the evaluation of patients at risk for COVID-19 are in a state of rapid change based on information released by regulatory bodies including the CDC and federal and state organizations. These policies and algorithms were followed during the patient's care in the ED.     MDM Rules/Calculators/A&P                       Final Clinical Impression(s) / ED Diagnoses Final diagnoses:  Alcohol-induced acute pancreatitis without infection or necrosis  Person under investigation for COVID-19  Hypokalemia    Rx / DC Orders ED Discharge Orders         Ordered    HYDROcodone-acetaminophen (NORCO/VICODIN) 5-325 MG tablet  Every 4 hours PRN     04/30/19 1946    potassium chloride (KLOR-CON) 10  MEQ tablet  Daily     04/30/19 1946           Discharge Instructions     Your labwork and CT scan showed  inflammation of your pancreas consistent with pancreatitis. Please increase your water intake over the next several days to help stay hydrated. I have prescribed a short course of pain medication to take AS NEEDED for pain.   Please take your potassium supplement daily and have your potassium level rechecked by your PCP in 1-2 weeks.   We have also swabbed you for COVID 19 today as your chest xray showed concern for lung changes consistent with likely covid 19. Please stay home and self isolate until you receive your results. If negative you may return to work immediately. If positive you will need to stay home and self isolate for 14 days starting today. (cleared: 05/15/2019).   Return to the ED for any worsening symptoms including worsening pain, vomiting, inability to keep liquids down, fevers > 100.4, shortness of breath.        Eustaquio Maize, PA-C 04/30/19 1950    Wyvonnia Dusky, MD 05/01/19 [phone]

## 2019-04-30 NOTE — Discharge Instructions (Signed)
Your labwork and CT scan showed inflammation of your pancreas consistent with pancreatitis. Please increase your water intake over the next several days to help stay hydrated. I have prescribed a short course of pain medication to take AS NEEDED for pain.   Please take your potassium supplement daily and have your potassium level rechecked by your PCP in 1-2 weeks.   We have also swabbed you for COVID 19 today as your chest xray showed concern for lung changes consistent with likely covid 19. Please stay home and self isolate until you receive your results. If negative you may return to work immediately. If positive you will need to stay home and self isolate for 14 days starting today. (cleared: 05/15/2019).   Return to the ED for any worsening symptoms including worsening pain, vomiting, inability to keep liquids down, fevers > 100.4, shortness of breath.

## 2019-04-30 NOTE — ED Triage Notes (Signed)
C/o flank pain for last 2 days.  Urine yellow but only void a small amount.  Rates pain 2/10 after taking Advil at 11 am and heating pad.

## 2019-05-02 LAB — NOVEL CORONAVIRUS, NAA (HOSP ORDER, SEND-OUT TO REF LAB; TAT 18-24 HRS): SARS-CoV-2, NAA: NOT DETECTED

## 2019-09-12 DIAGNOSIS — Z23 Encounter for immunization: Secondary | ICD-10-CM | POA: Diagnosis not present

## 2019-09-14 ENCOUNTER — Ambulatory Visit
Admission: EM | Admit: 2019-09-14 | Discharge: 2019-09-14 | Disposition: A | Discharge status: Home / Self Care | Payer: Medicaid Other | Attending: Emergency Medicine | Admitting: Emergency Medicine

## 2019-09-14 DIAGNOSIS — M109 Gout, unspecified: Secondary | ICD-10-CM

## 2019-09-14 MED ORDER — PREDNISONE 20 MG PO TABS: 20.0000 mg | ORAL_TABLET | Freq: Two times a day (BID) | ORAL | 0 refills | Status: AC

## 2019-09-14 MED ORDER — METHYLPREDNISOLONE SODIUM SUCC 125 MG IJ SOLR
125.0000 mg | Freq: Once | INTRAMUSCULAR | Status: AC
  Administered 2019-09-14: 125 mg via INTRAMUSCULAR

## 2019-09-14 NOTE — ED Provider Notes (Signed)
[name]   [id number] 09/14/19 Arrival Time: [id number]  CC: RT foot  SUBJECTIVE: History from: patient. John Carroll is a 59 y.o. male complains of right ankle/ foot pain x few days.  Denies a precipitating event or specific injury.  Localizes the pain to the inside of ankle and great toe.  Describes the pain as constant and achy in character.  Denies alleviating factors.  Symptoms are made worse to the touch.  Reports similar symptoms in the past with gout.  Denies fever, chills, erythema, ecchymosis, effusion, weakness.      ROS: As per HPI.  All other pertinent ROS negative.     Past Medical History:  Diagnosis Date  . Acute renal failure (ARF) ([place]) 5/18, 11/18  . Chronic back pain   . Epididymitis   . GERD (gastroesophageal reflux disease)   . Hypertension    stopped medications due to low BPs  . Pancreatitis 08/2016   Past Surgical History:  Procedure Laterality Date  . BIOPSY  04/05/2017   Procedure: BIOPSY;  Surgeon: John Binder, MD;  [facility];  Service: Endoscopy;;  duodenum  . COLONOSCOPY WITH PROPOFOL N/A 04/05/2017   Procedure: COLONOSCOPY WITH PROPOFOL;  Surgeon: John Binder, MD;  [facility];  Service: Endoscopy;  Laterality: N/A;  8:30am  . ESOPHAGOGASTRODUODENOSCOPY (EGD) WITH PROPOFOL N/A 04/05/2017   Procedure: ESOPHAGOGASTRODUODENOSCOPY (EGD) WITH PROPOFOL;  Surgeon: John Binder, MD;  [facility];  Service: Endoscopy;  Laterality: N/A;  . POLYPECTOMY  04/05/2017   Procedure: POLYPECTOMY;  Surgeon: John Binder, MD;  [facility];  Service: Endoscopy;;  colon   No Known Allergies No current facility-administered medications on file prior to encounter.   Current Outpatient Medications on File Prior to Encounter  Medication Sig Dispense Refill  . Multiple Vitamin (MULTIVITAMIN WITH MINERALS) TABS tablet Take 1 tablet by mouth daily.    . feeding supplement (BOOST / RESOURCE BREEZE) LIQD  Take 1 Container by mouth 3 (three) times daily between meals. 90 Container 0  . HYDROcodone-acetaminophen (NORCO/VICODIN) 5-325 MG tablet Take 1 tablet by mouth every 4 (four) hours as needed for severe pain. 15 tablet 0  . naproxen (NAPROSYN) 500 MG tablet Take 1 tablet (500 mg total) by mouth 2 (two) times daily. 30 tablet 0  . pantoprazole (PROTONIX) 40 MG tablet Take 1 tablet (40 mg total) by mouth 2 (two) times daily before a meal. 60 tablet 1  . potassium chloride (KLOR-CON) 10 MEQ tablet Take 1 tablet (10 mEq total) by mouth daily for 5 days. 5 tablet 0   Social History   Socioeconomic History  . Marital status: Divorced    Spouse name: Not on file  . Number of children: Not on file  . Years of education: Not on file  . Highest education level: Not on file  Occupational History  . Occupation: disabled  Tobacco Use  . Smoking status: Current Every Day Smoker    Packs/day: 1.00    Years: 27.00    Pack years: 27.00    Types: Cigarettes  . Smokeless tobacco: Never Used  Substance and Sexual Activity  . Alcohol use: Yes    Comment: occasionally  . Drug use: Yes    Types: Marijuana    Comment: occasional use, last use maybe a couple of weeks ago  . Sexual activity: Not Currently    Birth control/protection: None  Other Topics Concern  . Not on file  Social History Narrative  . Not on file   Social Determinants of Health   Financial Resource Strain:   . Difficulty of Paying Living Expenses:   Food Insecurity:   . Worried About Charity fundraiser in the Last Year:   . Arboriculturist in the Last Year:   Transportation Needs:   . Film/video editor (Medical):   Marland Kitchen Lack of Transportation (Non-Medical):   Physical Activity:   . Days of Exercise per Week:   . Minutes of Exercise per Session:   Stress:   . Feeling of Stress :   Social Connections:   . Frequency of Communication with Friends and Family:   . Frequency of Social Gatherings with Friends and Family:     . Attends Religious Services:   . Active Member of Clubs or Organizations:   . Attends Archivist Meetings:   Marland Kitchen Marital Status:   Intimate Partner Violence:   . Fear of Current or Ex-Partner:   . Emotionally Abused:   Marland Kitchen Physically Abused:   . Sexually Abused:    Family History  Problem Relation Age of Onset  . Kidney disease Mother   . Hypertension Mother   . Diabetes Mellitus II Mother   . Heart attack Father 65       Died of MI in 6  . Heart attack Sister   . Hypertension Sister     OBJECTIVE:  Vitals:   09/14/19 1628  BP: 123/85  Pulse: (!) 101  Resp: 16  Temp: 99.3 F (37.4 C)  TempSrc: Oral  SpO2: 98%    General appearance: ALERT; in no acute distress.  Head: NCAT Lungs: Normal respiratory effort CV: Dorsalis pedis pulse 2+ Musculoskeletal: RT foot Inspection: mild erythema over lateral ankle Palpation: TTP over medial ankle, and medial aspect of 1st MTP joint ROM: FROM active and passive Strength: 5/5 dorsiflexion, 5/5 plantar flexion Skin: warm and dry Neurologic: Ambulates without difficulty; Sensation intact about the lower extremities Psychological: alert and cooperative; normal mood and affect   ASSESSMENT & PLAN:  1. Acute gout of multiple sites, unspecified cause     Meds ordered this encounter  Medications  . predniSONE (DELTASONE) 20 MG tablet    Sig: Take 1 tablet (20 mg total) by mouth 2 (two) times daily with a meal for 5 days.    Dispense:  10 tablet    Refill:  0    Order Specific Question:   Supervising Provider    Answer:   Raylene Everts [id number]  . methylPREDNISolone sodium succinate (SOLU-MEDROL) 125 mg/2 mL injection 125 mg   Steroid shot given in office Prescribed prednisone.  Take as directed and to completion Follow up with PCP for further evaluation and management Return or go to the ED if you have any new or worsening symptoms fever, chills, nausea, vomiting, redness, swelling, bruising, deformity,  worsening symptoms despite treatment, etc...   Reviewed expectations re: course of current medical issues. Questions answered. Outlined signs and symptoms indicating need for more acute intervention. Patient verbalized understanding. After Visit Summary given.    Lestine Box, PA-C 09/14/19 1638

## 2019-09-14 NOTE — Discharge Instructions (Signed)
Steroid shot given in office Prescribed prednisone.  Take as directed and to completion Follow up with PCP for further evaluation and management Return or go to the ED if you have any new or worsening symptoms fever, chills, nausea, vomiting, redness, swelling, bruising, deformity, worsening symptoms despite treatment, etc..Marland Kitchen

## 2019-10-11 DIAGNOSIS — Z23 Encounter for immunization: Secondary | ICD-10-CM | POA: Diagnosis not present

## 2019-11-18 ENCOUNTER — Encounter: Payer: Self-pay | Admitting: Emergency Medicine

## 2019-11-18 ENCOUNTER — Ambulatory Visit (INDEPENDENT_AMBULATORY_CARE_PROVIDER_SITE_OTHER): Payer: Medicaid Other

## 2019-11-18 ENCOUNTER — Ambulatory Visit
Admission: EM | Admit: 2019-11-18 | Discharge: 2019-11-18 | Disposition: A | Discharge status: Home / Self Care | Payer: Medicaid Other | Attending: Emergency Medicine | Admitting: Emergency Medicine

## 2019-11-18 ENCOUNTER — Other Ambulatory Visit: Payer: Self-pay

## 2019-11-18 DIAGNOSIS — M25462 Effusion, left knee: Secondary | ICD-10-CM | POA: Diagnosis not present

## 2019-11-18 DIAGNOSIS — M25562 Pain in left knee: Secondary | ICD-10-CM

## 2019-11-18 MED ORDER — PREDNISONE 10 MG (21) PO TBPK: ORAL_TABLET | ORAL | 0 refills | Status: DC

## 2019-11-18 MED ORDER — METHYLPREDNISOLONE SODIUM SUCC 125 MG IJ SOLR
60.0000 mg | Freq: Once | INTRAMUSCULAR | Status: AC
  Administered 2019-11-18: 60 mg via INTRAMUSCULAR

## 2019-11-18 NOTE — ED Provider Notes (Signed)
[place]   [id number] 11/18/19 Arrival Time: 1501   Chief Complaint  Patient presents with  . Joint Swelling     SUBJECTIVE: History from: patient.  DEZMEN ALCOCK is a 59 y.o. male who presented to the urgent care with a complaint of left knee pain and swelling for the past week.  Report he jumped off of a lawnmower and ran away from bees.  Developed the symptom thereafter.  He localizes the pain to the left knee.  He describes the pain as constant and achy.  He has tried OTC medications without relief.  His symptoms are made worse with ROM.  He denies similar symptoms in the past.  Denies similar symptoms in the past.  Denies chills, nausea, vomiting, diarrhea.   ROS: As per HPI.  All other pertinent ROS negative.     Past Medical History:  Diagnosis Date  . Acute renal failure (ARF) ([place]) 5/18, 11/18  . Chronic back pain   . Epididymitis   . GERD (gastroesophageal reflux disease)   . Hypertension    stopped medications due to low BPs  . Pancreatitis 08/2016   Past Surgical History:  Procedure Laterality Date  . BIOPSY  04/05/2017   Procedure: BIOPSY;  Surgeon: Danie Binder, MD;  [facility];  Service: Endoscopy;;  duodenum  . COLONOSCOPY WITH PROPOFOL N/A 04/05/2017   Procedure: COLONOSCOPY WITH PROPOFOL;  Surgeon: Danie Binder, MD;  [facility];  Service: Endoscopy;  Laterality: N/A;  8:30am  . ESOPHAGOGASTRODUODENOSCOPY (EGD) WITH PROPOFOL N/A 04/05/2017   Procedure: ESOPHAGOGASTRODUODENOSCOPY (EGD) WITH PROPOFOL;  Surgeon: Danie Binder, MD;  [facility];  Service: Endoscopy;  Laterality: N/A;  . POLYPECTOMY  04/05/2017   Procedure: POLYPECTOMY;  Surgeon: Danie Binder, MD;  [facility];  Service: Endoscopy;;  colon   No Known Allergies No current facility-administered medications on file prior to encounter.   Current Outpatient Medications on File Prior to Encounter  Medication Sig Dispense Refill   . feeding supplement (BOOST / RESOURCE BREEZE) LIQD Take 1 Container by mouth 3 (three) times daily between meals. 90 Container 0  . HYDROcodone-acetaminophen (NORCO/VICODIN) 5-325 MG tablet Take 1 tablet by mouth every 4 (four) hours as needed for severe pain. 15 tablet 0  . Multiple Vitamin (MULTIVITAMIN WITH MINERALS) TABS tablet Take 1 tablet by mouth daily.    . naproxen (NAPROSYN) 500 MG tablet Take 1 tablet (500 mg total) by mouth 2 (two) times daily. 30 tablet 0  . pantoprazole (PROTONIX) 40 MG tablet Take 1 tablet (40 mg total) by mouth 2 (two) times daily before a meal. 60 tablet 1  . potassium chloride (KLOR-CON) 10 MEQ tablet Take 1 tablet (10 mEq total) by mouth daily for 5 days. 5 tablet 0   Social History   Socioeconomic History  . Marital status: Divorced    Spouse name: Not on file  . Number of children: Not on file  . Years of education: Not on file  . Highest education level: Not on file  Occupational History  . Occupation: disabled  Tobacco Use  . Smoking status: Current Every Day Smoker    Packs/day: 1.00    Years: 27.00    Pack years: 27.00    Types: Cigarettes  . Smokeless tobacco: Never Used  Vaping Use  . Vaping Use: Never used  Substance and Sexual Activity  . Alcohol use: Yes    Comment: occasionally  . Drug use:  Yes    Types: Marijuana    Comment: occasional use, last use maybe a couple of weeks ago  . Sexual activity: Not Currently    Birth control/protection: None  Other Topics Concern  . Not on file  Social History Narrative  . Not on file   Social Determinants of Health   Financial Resource Strain:   . Difficulty of Paying Living Expenses:   Food Insecurity:   . Worried About Charity fundraiser in the Last Year:   . Arboriculturist in the Last Year:   Transportation Needs:   . Film/video editor (Medical):   Marland Kitchen Lack of Transportation (Non-Medical):   Physical Activity:   . Days of Exercise per Week:   . Minutes of Exercise per  Session:   Stress:   . Feeling of Stress :   Social Connections:   . Frequency of Communication with Friends and Family:   . Frequency of Social Gatherings with Friends and Family:   . Attends Religious Services:   . Active Member of Clubs or Organizations:   . Attends Archivist Meetings:   Marland Kitchen Marital Status:   Intimate Partner Violence:   . Fear of Current or Ex-Partner:   . Emotionally Abused:   Marland Kitchen Physically Abused:   . Sexually Abused:    Family History  Problem Relation Age of Onset  . Kidney disease Mother   . Hypertension Mother   . Diabetes Mellitus II Mother   . Heart attack Father 79       Died of MI in 1  . Heart attack Sister   . Hypertension Sister     OBJECTIVE:  Vitals:   11/18/19 1530 11/18/19 1531  BP:  (!) 144/91  Pulse:  99  Resp:  18  Temp:  98.6 F (37 C)  TempSrc:  Oral  SpO2:  94%  Weight: 148 lb (67.1 kg)   Height: 5\' 8"  (1.727 m)      Physical Exam Vitals and nursing note reviewed.  Constitutional:      General: He is not in acute distress.    Appearance: Normal appearance. He is normal weight. He is not ill-appearing, toxic-appearing or diaphoretic.  Cardiovascular:     Rate and Rhythm: Normal rate and regular rhythm.     Pulses: Normal pulses.     Heart sounds: Normal heart sounds. No murmur heard.  No friction rub. No gallop.   Pulmonary:     Effort: Pulmonary effort is normal. No respiratory distress.     Breath sounds: Normal breath sounds. No stridor. No wheezing, rhonchi or rales.  Chest:     Chest wall: No tenderness.  Musculoskeletal:     Right knee: Normal.     Left knee: Swelling present. Tenderness present.     Comments: Patient is unable to bear weight.  No surface trauma or obvious erythema or warmth.  The left knee is with obvious deformity when compared to the right knee.  Patient is unable to deep knee bend.  Unable to fully extend knee due to pain.  Limited range of motion.  No pain with calf massage.   Anterior drawer test negative.  Neurovascular status intact.  Neurological:     Mental Status: He is alert and oriented to person, place, and time.     LABS:  No results found for this or any previous visit (from the past 24 hour(s)).    DG KNEE COMPLETE 4 VIEWS LEFT APPLY KNEE  SLEEVE CRUTCHES  ASSESSMENT & PLAN:  1. Acute pain of left knee     Meds ordered this encounter  Medications  . methylPREDNISolone sodium succinate (SOLU-MEDROL) 125 mg/2 mL injection 60 mg  . predniSONE (STERAPRED UNI-PAK 21 TAB) 10 MG (21) TBPK tablet    Sig: Take 6 tabs by mouth daily  for 1 days, then 5 tabs for 1 days, then 4 tabs for 1 days, then 3 tabs for 1 days, 2 tabs for 1 days, then 1 tab by mouth daily for 1 days    Dispense:  21 tablet    Refill:  0   Patient is stable at discharge.  Left knee x-ray is negative for bony abnormality including fracture or dislocation.  I have reviewed the x-ray myself and the radiologist interpretation.  I am in agreement with the radiologist interpretation.  Discharge Instructions Knee sleeve was applied in office Follow RICE instruction as attached Prednisone was prescribed/take as directed Follow-up with PCP Return or go to ED for worsening of symptoms   Reviewed expectations re: course of current medical issues. Questions answered. Outlined signs and symptoms indicating need for more acute intervention. Patient verbalized understanding. After Visit Summary given.      Note: This document was prepared using Dragon voice recognition software and may include unintentional dictation errors.    Emerson Monte, [place] 11/18/19 1622

## 2019-11-18 NOTE — ED Triage Notes (Signed)
LT knee swelling that started x a week ago after jumping off a lawn mower away from some bees.  Pt has had problems with this knee before but never this bad. Pt can barley put weight on the knee.

## 2019-11-18 NOTE — Discharge Instructions (Addendum)
Knee sleeve was applied in office Follow RICE instruction as attached Prednisone was prescribed/take as directed Follow-up with PCP Return or go to ED for worsening of symptoms

## 2019-12-08 ENCOUNTER — Ambulatory Visit
Admission: EM | Admit: 2019-12-08 | Discharge: 2019-12-08 | Disposition: A | Discharge status: Home / Self Care | Payer: Medicaid Other | Attending: Emergency Medicine | Admitting: Emergency Medicine

## 2019-12-08 ENCOUNTER — Other Ambulatory Visit: Payer: Self-pay

## 2019-12-08 ENCOUNTER — Encounter: Payer: Self-pay | Admitting: Emergency Medicine

## 2019-12-08 DIAGNOSIS — M109 Gout, unspecified: Secondary | ICD-10-CM | POA: Diagnosis not present

## 2019-12-08 MED ORDER — DEXAMETHASONE SODIUM PHOSPHATE 10 MG/ML IJ SOLN
10.0000 mg | Freq: Once | INTRAMUSCULAR | Status: AC
  Administered 2019-12-08: 10 mg via INTRAMUSCULAR

## 2019-12-08 MED ORDER — PREDNISONE 10 MG (21) PO TBPK: ORAL_TABLET | Freq: Every day | ORAL | 0 refills | Status: DC

## 2019-12-08 NOTE — ED Triage Notes (Signed)
LT foot swollen x 1 day, pt has had several gout flares recently.

## 2019-12-08 NOTE — ED Provider Notes (Signed)
[place]   [id number] 12/08/19 Arrival Time: 1210  CC: LT toe PAIN  SUBJECTIVE: History from: patient. John Carroll is a 59 y.o. male complains of LT toe pain that began 1 day ago.  Denies a precipitating event or specific injury.  Localizes the pain to the LT great toe.  Describes the pain as intermittent and throbbing in character.  Has tried OTC medications with minimal relief.  Symptoms are made worse with weight-bearing.  Reports similar symptoms in the past with gout.  Complains of associated erythema, and swelling.  Denies fever, chills, ecchymosis, weakness.    ROS: As per HPI.  All other pertinent ROS negative.     Past Medical History:  Diagnosis Date  . Acute renal failure (ARF) ([place]) 5/18, 11/18  . Chronic back pain   . Epididymitis   . GERD (gastroesophageal reflux disease)   . Hypertension    stopped medications due to low BPs  . Pancreatitis 08/2016   Past Surgical History:  Procedure Laterality Date  . BIOPSY  04/05/2017   Procedure: BIOPSY;  Surgeon: Danie Binder, MD;  [facility];  Service: Endoscopy;;  duodenum  . COLONOSCOPY WITH PROPOFOL N/A 04/05/2017   Procedure: COLONOSCOPY WITH PROPOFOL;  Surgeon: Danie Binder, MD;  [facility];  Service: Endoscopy;  Laterality: N/A;  8:30am  . ESOPHAGOGASTRODUODENOSCOPY (EGD) WITH PROPOFOL N/A 04/05/2017   Procedure: ESOPHAGOGASTRODUODENOSCOPY (EGD) WITH PROPOFOL;  Surgeon: Danie Binder, MD;  [facility];  Service: Endoscopy;  Laterality: N/A;  . POLYPECTOMY  04/05/2017   Procedure: POLYPECTOMY;  Surgeon: Danie Binder, MD;  [facility];  Service: Endoscopy;;  colon   No Known Allergies No current facility-administered medications on file prior to encounter.   Current Outpatient Medications on File Prior to Encounter  Medication Sig Dispense Refill  . feeding supplement (BOOST / RESOURCE BREEZE) LIQD Take 1 Container by mouth 3 (three) times daily  between meals. 90 Container 0  . HYDROcodone-acetaminophen (NORCO/VICODIN) 5-325 MG tablet Take 1 tablet by mouth every 4 (four) hours as needed for severe pain. 15 tablet 0  . Multiple Vitamin (MULTIVITAMIN WITH MINERALS) TABS tablet Take 1 tablet by mouth daily.    . naproxen (NAPROSYN) 500 MG tablet Take 1 tablet (500 mg total) by mouth 2 (two) times daily. 30 tablet 0  . pantoprazole (PROTONIX) 40 MG tablet Take 1 tablet (40 mg total) by mouth 2 (two) times daily before a meal. 60 tablet 1  . potassium chloride (KLOR-CON) 10 MEQ tablet Take 1 tablet (10 mEq total) by mouth daily for 5 days. 5 tablet 0   Social History   Socioeconomic History  . Marital status: Divorced    Spouse name: Not on file  . Number of children: Not on file  . Years of education: Not on file  . Highest education level: Not on file  Occupational History  . Occupation: disabled  Tobacco Use  . Smoking status: Current Every Day Smoker    Packs/day: 1.00    Years: 27.00    Pack years: 27.00    Types: Cigarettes  . Smokeless tobacco: Never Used  Vaping Use  . Vaping Use: Never used  Substance and Sexual Activity  . Alcohol use: Yes    Comment: occasionally  . Drug use: Yes    Types: Marijuana    Comment: occasional use, last use maybe a couple of weeks ago  . Sexual activity: Not Currently  Birth control/protection: None  Other Topics Concern  . Not on file  Social History Narrative  . Not on file   Social Determinants of Health   Financial Resource Strain:   . Difficulty of Paying Living Expenses:   Food Insecurity:   . Worried About Charity fundraiser in the Last Year:   . Arboriculturist in the Last Year:   Transportation Needs:   . Film/video editor (Medical):   Marland Kitchen Lack of Transportation (Non-Medical):   Physical Activity:   . Days of Exercise per Week:   . Minutes of Exercise per Session:   Stress:   . Feeling of Stress :   Social Connections:   . Frequency of Communication  with Friends and Family:   . Frequency of Social Gatherings with Friends and Family:   . Attends Religious Services:   . Active Member of Clubs or Organizations:   . Attends Archivist Meetings:   Marland Kitchen Marital Status:   Intimate Partner Violence:   . Fear of Current or Ex-Partner:   . Emotionally Abused:   Marland Kitchen Physically Abused:   . Sexually Abused:    Family History  Problem Relation Age of Onset  . Kidney disease Mother   . Hypertension Mother   . Diabetes Mellitus II Mother   . Heart attack Father 30       Died of MI in 45  . Heart attack Sister   . Hypertension Sister     OBJECTIVE:  Vitals:   12/08/19 1227 12/08/19 1228  BP:  (!) 155/94  Pulse:  85  Resp:  18  Temp:  98.5 F (36.9 C)  TempSrc:  Oral  SpO2:  97%  Weight: 147 lb 11.3 oz (67 kg)   Height: 5\' 8"  (1.727 m)     General appearance: ALERT; in no acute distress.  Head: NCAT Lungs: Normal respiratory effort CV: Dorsalis pedis pulse 2+ Musculoskeletal: LT foot Inspection: Erythema and swelling localized to LT great toe Palpation: TTP over latera aspect of LT great toe MTP joint Skin: warm and dry Neurologic: Ambulates without difficulty; Sensation intact about the lower extremities Psychological: alert and cooperative; normal mood and affect  ASSESSMENT & PLAN:  1. Gout of big toe   2. Podagra     Meds ordered this encounter  Medications  . predniSONE (STERAPRED UNI-PAK 21 TAB) 10 MG (21) TBPK tablet    Sig: Take by mouth daily. Take 6 tabs by mouth daily  for 2 days, then 5 tabs for 2 days, then 4 tabs for 2 days, then 3 tabs for 2 days, 2 tabs for 2 days, then 1 tab by mouth daily for 2 days    Dispense:  42 tablet    Refill:  0    Order Specific Question:   Supervising Provider    Answer:   Raylene Everts [[id number]]  . dexamethasone (DECADRON) injection 10 mg    Steroid shot given in office Prednisone prescribed.  Take as directed and to completion Follow up with PCP for  further evaluation and management Return or go to the ED if you have any new or worsening symptoms fever, chills, nausea, vomiting, worsening symptoms despite treatment, etc..  Reviewed expectations re: course of current medical issues. Questions answered. Outlined signs and symptoms indicating need for more acute intervention. Patient verbalized understanding. After Visit Summary given.    Lestine Box, PA-C 12/08/19 1250

## 2019-12-08 NOTE — Discharge Instructions (Signed)
Steroid shot given in office Prednisone prescribed.  Take as directed and to completion Follow up with PCP for further evaluation and management Return or go to the ED if you have any new or worsening symptoms fever, chills, nausea, vomiting, worsening symptoms despite treatment, etc..

## 2020-01-25 ENCOUNTER — Encounter: Payer: Self-pay | Admitting: Emergency Medicine

## 2020-01-25 ENCOUNTER — Ambulatory Visit
Admission: EM | Admit: 2020-01-25 | Discharge: 2020-01-25 | Disposition: A | Discharge status: Home / Self Care | Payer: Medicaid Other | Attending: Emergency Medicine | Admitting: Emergency Medicine

## 2020-01-25 DIAGNOSIS — M25562 Pain in left knee: Secondary | ICD-10-CM | POA: Diagnosis not present

## 2020-01-25 DIAGNOSIS — G8929 Other chronic pain: Secondary | ICD-10-CM | POA: Diagnosis not present

## 2020-01-25 MED ORDER — ACETAMINOPHEN 500 MG PO TABS: 500.0000 mg | ORAL_TABLET | Freq: Four times a day (QID) | ORAL | 0 refills | Status: DC | PRN

## 2020-01-25 MED ORDER — PREDNISONE 10 MG (21) PO TBPK: ORAL_TABLET | Freq: Every day | ORAL | 0 refills | Status: DC

## 2020-01-25 NOTE — ED Triage Notes (Signed)
LT knee swollen and painful that started yesterday, hx of gout. Has appt with pcp 01/31/20

## 2020-01-25 NOTE — ED Provider Notes (Signed)
[name]   [id number] 01/25/20 Arrival Time: 1602   Chief Complaint  Patient presents with   Knee Pain     SUBJECTIVE: History from: patient.  John Carroll is a 59 y.o. male with history of gout presented to the urgent care with a complaint of left knee pain and swelling that has getting worse yesterday.  Denies any precipitating event.  Localized pain to the left knee.  He describes the pain as constant and achy.  He has tried OTC medications without relief.  His symptoms are made worse with ROM.  He denies similar symptoms in the past.      ROS: As per HPI.  All other pertinent ROS negative.     Past Medical History:  Diagnosis Date   Acute renal failure (ARF) ([place]) 5/18, 11/18   Chronic back pain    Epididymitis    GERD (gastroesophageal reflux disease)    Hypertension    stopped medications due to low BPs   Pancreatitis 08/2016   Past Surgical History:  Procedure Laterality Date   BIOPSY  04/05/2017   Procedure: BIOPSY;  Surgeon: John Binder, MD;  [facility];  Service: Endoscopy;;  duodenum   COLONOSCOPY WITH PROPOFOL N/A 04/05/2017   Procedure: COLONOSCOPY WITH PROPOFOL;  Surgeon: John Binder, MD;  [facility];  Service: Endoscopy;  Laterality: N/A;  8:30am   ESOPHAGOGASTRODUODENOSCOPY (EGD) WITH PROPOFOL N/A 04/05/2017   Procedure: ESOPHAGOGASTRODUODENOSCOPY (EGD) WITH PROPOFOL;  Surgeon: John Binder, MD;  [facility];  Service: Endoscopy;  Laterality: N/A;   POLYPECTOMY  04/05/2017   Procedure: POLYPECTOMY;  Surgeon: John Binder, MD;  [facility];  Service: Endoscopy;;  colon   No Known Allergies No current facility-administered medications on file prior to encounter.   Current Outpatient Medications on File Prior to Encounter  Medication Sig Dispense Refill   feeding supplement (BOOST / RESOURCE BREEZE) LIQD Take 1 Container by mouth 3 (three) times daily between meals. 90  Container 0   HYDROcodone-acetaminophen (NORCO/VICODIN) 5-325 MG tablet Take 1 tablet by mouth every 4 (four) hours as needed for severe pain. 15 tablet 0   Multiple Vitamin (MULTIVITAMIN WITH MINERALS) TABS tablet Take 1 tablet by mouth daily.     naproxen (NAPROSYN) 500 MG tablet Take 1 tablet (500 mg total) by mouth 2 (two) times daily. 30 tablet 0   pantoprazole (PROTONIX) 40 MG tablet Take 1 tablet (40 mg total) by mouth 2 (two) times daily before a meal. 60 tablet 1   potassium chloride (KLOR-CON) 10 MEQ tablet Take 1 tablet (10 mEq total) by mouth daily for 5 days. 5 tablet 0   Social History   Socioeconomic History   Marital status: Divorced    Spouse name: Not on file   Number of children: Not on file   Years of education: Not on file   Highest education level: Not on file  Occupational History   Occupation: disabled  Tobacco Use   Smoking status: Current Every Day Smoker    Packs/day: 1.00    Years: 27.00    Pack years: 27.00    Types: Cigarettes   Smokeless tobacco: Never Used  Scientific laboratory technician Use: Never used  Substance and Sexual Activity   Alcohol use: Yes    Comment: occasionally   Drug use: Yes    Types: Marijuana    Comment: occasional use, last use maybe a couple of weeks ago  Sexual activity: Not Currently    Birth control/protection: None  Other Topics Concern   Not on file  Social History Narrative   Not on file   Social Determinants of Health   Financial Resource Strain:    Difficulty of Paying Living Expenses: Not on file  Food Insecurity:    Worried About [place] in the Last Year: Not on file   Ran Out of Food in the Last Year: Not on file  Transportation Needs:    Lack of Transportation (Medical): Not on file   Lack of Transportation (Non-Medical): Not on file  Physical Activity:    Days of Exercise per Week: Not on file   Minutes of Exercise per Session: Not on file  Stress:    Feeling of Stress :  Not on file  Social Connections:    Frequency of Communication with Friends and Family: Not on file   Frequency of Social Gatherings with Friends and Family: Not on file   Attends Religious Services: Not on file   Active Member of Clubs or Organizations: Not on file   Attends Archivist Meetings: Not on file   Marital Status: Not on file  Intimate Partner Violence:    Fear of Current or Ex-Partner: Not on file   Emotionally Abused: Not on file   Physically Abused: Not on file   Sexually Abused: Not on file   Family History  Problem Relation Age of Onset   Kidney disease Mother    Hypertension Mother    Diabetes Mellitus II Mother    Heart attack Father 51       Died of MI in [place] attack Sister    Hypertension Sister     OBJECTIVE:  Vitals:   01/25/20 1636 01/25/20 1637  BP: (!) 153/84   Pulse: 89   Resp: 18   Temp: 98.4 F (36.9 C)   TempSrc: Oral   SpO2: 95%   Weight:  147 lb 11.3 oz (67 kg)  Height:  5\' 8"  (1.727 m)    Physical Exam Vitals and nursing note reviewed.  Constitutional:      General: He is not in acute distress.    Appearance: Normal appearance. He is normal weight. He is not ill-appearing, toxic-appearing or diaphoretic.  Cardiovascular:     Rate and Rhythm: Normal rate and regular rhythm.     Pulses: Normal pulses.     Heart sounds: Normal heart sounds. No murmur heard.  No friction rub. No gallop.   Pulmonary:     Effort: Pulmonary effort is normal. No respiratory distress.     Breath sounds: Normal breath sounds. No stridor. No wheezing, rhonchi or rales.  Chest:     Chest wall: No tenderness.  Musculoskeletal:        General: Tenderness present.     Right knee: Normal.     Left knee: Swelling present. Tenderness present.     Comments:  Patient is able to ambulate and bear weight with pain.  The  left knee is with obvious deformity when compared to the right knee.  Warmth present.  No ecchymosis, open  wound, lesion present.  Neurovascular status intact.  Neurological:     Mental Status: He is alert and oriented to person, place, and time.      LABS:  No results found for this or any previous visit (from the past 24 hour(s)).   ASSESSMENT & PLAN:  1. Chronic pain of left  knee     Meds ordered this encounter  Medications   predniSONE (STERAPRED UNI-PAK 21 TAB) 10 MG (21) TBPK tablet    Sig: Take by mouth daily. Take 6 tabs by mouth daily  for 2 days, then 5 tabs for 2 days, then 4 tabs for 2 days, then 3 tabs for 2 days, 2 tabs for 2 days, then 1 tab by mouth daily for 2 days    Dispense:  42 tablet    Refill:  0   acetaminophen (TYLENOL) 500 MG tablet    Sig: Take 1 tablet (500 mg total) by mouth every 6 (six) hours as needed.    Dispense:  30 tablet    Refill:  0    Prescribed prednisone/take as directed Continue to use a knee sleeve Take OTC Tylenol as needed for pain Follow RICE instruction that is attached Follow up with PCP for further evaluation and management Return or go to the ED if you have any new or worsening symptoms   Reviewed expectations re: course of current medical issues. Questions answered. Outlined signs and symptoms indicating need for more acute intervention. Patient verbalized understanding. After Visit Summary given.         Emerson Monte, FNP 01/25/20 1714

## 2020-01-25 NOTE — Discharge Instructions (Addendum)
Prescribed prednisone/take as directed Continue to use a knee sleeve Take OTC Tylenol as needed for pain Follow RICE instruction that is attached Follow up with PCP for further evaluation and management Return or go to the ED if you have any new or worsening symptoms

## 2020-03-11 ENCOUNTER — Ambulatory Visit
Admission: EM | Admit: 2020-03-11 | Discharge: 2020-03-11 | Disposition: A | Discharge status: Home / Self Care | Payer: Medicaid Other | Attending: Emergency Medicine | Admitting: Emergency Medicine

## 2020-03-11 DIAGNOSIS — M25571 Pain in right ankle and joints of right foot: Secondary | ICD-10-CM | POA: Diagnosis not present

## 2020-03-11 DIAGNOSIS — M109 Gout, unspecified: Secondary | ICD-10-CM

## 2020-03-11 MED ORDER — PREDNISONE 10 MG (21) PO TBPK: ORAL_TABLET | Freq: Every day | ORAL | 0 refills | Status: DC

## 2020-03-11 MED ORDER — METHYLPREDNISOLONE SODIUM SUCC 125 MG IJ SOLR
125.0000 mg | Freq: Once | INTRAMUSCULAR | Status: AC
  Administered 2020-03-11: 125 mg via INTRAMUSCULAR

## 2020-03-11 NOTE — ED Triage Notes (Signed)
Pt presents with right ankle pain that  Began yesterday , believes to be gout . Pt reports frequent flares

## 2020-03-11 NOTE — Discharge Instructions (Signed)
Continue conservative management of rest, ice, and elevate Steroid shot given in office Prednisone prescribed Follow up with PCP for further evaluation and management Return or go to the ER if you have any new or worsening symptoms (fever, chills, chest pain, redness, swelling, bruising, etc...)

## 2020-03-11 NOTE — ED Provider Notes (Signed)
[place]   [id number] 03/11/20 Arrival Time: 3491  CC:RT ankle PAIN  SUBJECTIVE: History from: patient. John Carroll is a 59 y.o. male complains of RT ankle pain x 1 day.  Denies a precipitating event or specific injury.  Hx of reoccurring gout.  Believe this to be another gout flare.  Localizes the pain to the inside of RT ankle.  Describes the pain as constant and achy in character.  Denies alleviating factors.  Symptoms are made worse to the touch.  Reports similar symptoms in the past with gout.  Complains of associated redness and swelling.  Denies fever, chills, ecchymosis, weakness, numbness and tingling   ROS: As per HPI.  All other pertinent ROS negative.     Past Medical History:  Diagnosis Date  . Acute renal failure (ARF) ([place]) 5/18, 11/18  . Chronic back pain   . Epididymitis   . GERD (gastroesophageal reflux disease)   . Hypertension    stopped medications due to low BPs  . Pancreatitis 08/2016   Past Surgical History:  Procedure Laterality Date  . BIOPSY  04/05/2017   Procedure: BIOPSY;  Surgeon: Danie Binder, MD;  [facility];  Service: Endoscopy;;  duodenum  . COLONOSCOPY WITH PROPOFOL N/A 04/05/2017   Procedure: COLONOSCOPY WITH PROPOFOL;  Surgeon: Danie Binder, MD;  [facility];  Service: Endoscopy;  Laterality: N/A;  8:30am  . ESOPHAGOGASTRODUODENOSCOPY (EGD) WITH PROPOFOL N/A 04/05/2017   Procedure: ESOPHAGOGASTRODUODENOSCOPY (EGD) WITH PROPOFOL;  Surgeon: Danie Binder, MD;  [facility];  Service: Endoscopy;  Laterality: N/A;  . POLYPECTOMY  04/05/2017   Procedure: POLYPECTOMY;  Surgeon: Danie Binder, MD;  [facility];  Service: Endoscopy;;  colon   No Known Allergies No current facility-administered medications on file prior to encounter.   Current Outpatient Medications on File Prior to Encounter  Medication Sig Dispense Refill  . acetaminophen (TYLENOL) 500 MG tablet Take 1 tablet  (500 mg total) by mouth every 6 (six) hours as needed. 30 tablet 0  . feeding supplement (BOOST / RESOURCE BREEZE) LIQD Take 1 Container by mouth 3 (three) times daily between meals. 90 Container 0  . HYDROcodone-acetaminophen (NORCO/VICODIN) 5-325 MG tablet Take 1 tablet by mouth every 4 (four) hours as needed for severe pain. 15 tablet 0  . Multiple Vitamin (MULTIVITAMIN WITH MINERALS) TABS tablet Take 1 tablet by mouth daily.    . naproxen (NAPROSYN) 500 MG tablet Take 1 tablet (500 mg total) by mouth 2 (two) times daily. 30 tablet 0  . pantoprazole (PROTONIX) 40 MG tablet Take 1 tablet (40 mg total) by mouth 2 (two) times daily before a meal. 60 tablet 1  . potassium chloride (KLOR-CON) 10 MEQ tablet Take 1 tablet (10 mEq total) by mouth daily for 5 days. 5 tablet 0   Social History   Socioeconomic History  . Marital status: Divorced    Spouse name: Not on file  . Number of children: Not on file  . Years of education: Not on file  . Highest education level: Not on file  Occupational History  . Occupation: disabled  Tobacco Use  . Smoking status: Current Every Day Smoker    Packs/day: 1.00    Years: 27.00    Pack years: 27.00    Types: Cigarettes  . Smokeless tobacco: Never Used  Vaping Use  . Vaping Use: Never used  Substance and Sexual Activity  . Alcohol use: Yes  Comment: occasionally  . Drug use: Yes    Types: Marijuana    Comment: occasional use, last use maybe a couple of weeks ago  . Sexual activity: Not Currently    Birth control/protection: None  Other Topics Concern  . Not on file  Social History Narrative  . Not on file   Social Determinants of Health   Financial Resource Strain:   . Difficulty of Paying Living Expenses: Not on file  Food Insecurity:   . Worried About [employer] fundraiser in the Last Year: Not on file  . Ran Out of Food in the Last Year: Not on file  Transportation Needs:   . Lack of Transportation (Medical): Not on file  . Lack of  Transportation (Non-Medical): Not on file  Physical Activity:   . Days of Exercise per Week: Not on file  . Minutes of Exercise per Session: Not on file  Stress:   . Feeling of Stress : Not on file  Social Connections:   . Frequency of Communication with Friends and Family: Not on file  . Frequency of Social Gatherings with Friends and Family: Not on file  . Attends Religious Services: Not on file  . Active Member of Clubs or Organizations: Not on file  . Attends Archivist Meetings: Not on file  . Marital Status: Not on file  Intimate Partner Violence:   . Fear of Current or Ex-Partner: Not on file  . Emotionally Abused: Not on file  . Physically Abused: Not on file  . Sexually Abused: Not on file   Family History  Problem Relation Age of Onset  . Kidney disease Mother   . Hypertension Mother   . Diabetes Mellitus II Mother   . Heart attack Father 40       Died of MI in 46  . Heart attack Sister   . Hypertension Sister     OBJECTIVE:  Vitals:   03/11/20 1124  BP: (!) 143/96  Pulse: 95  Resp: 20  Temp: 98.1 F (36.7 C)  SpO2: 97%    General appearance: ALERT; in no acute distress.  Head: NCAT Lungs: Normal respiratory effort CV: Dorsalis pedis pulse 2+ Musculoskeletal: RT ankle Inspection: Erythema over medial ankle Palpation: TTP over medial aspect of ankle ROM: LROM Strength: deferred Skin: warm and dry Neurologic: Ambulates without difficulty Psychological: alert and cooperative; normal mood and affect  ASSESSMENT & PLAN:  1. Acute right ankle pain   2. Acute gout of right ankle, unspecified cause     Meds ordered this encounter  Medications  . predniSONE (STERAPRED UNI-PAK 21 TAB) 10 MG (21) TBPK tablet    Sig: Take by mouth daily. Take 6 tabs by mouth daily  for 2 days, then 5 tabs for 2 days, then 4 tabs for 2 days, then 3 tabs for 2 days, 2 tabs for 2 days, then 1 tab by mouth daily for 2 days    Dispense:  42 tablet    Refill:  0     Order Specific Question:   Supervising Provider    Answer:   Raylene Everts [[id number]]  . methylPREDNISolone sodium succinate (SOLU-MEDROL) 125 mg/2 mL injection 125 mg    Continue conservative management of rest, ice, and elevate Steroid shot given in office Prednisone prescribed Follow up with PCP for further evaluation and management Return or go to the ER if you have any new or worsening symptoms (fever, chills, chest pain, redness, swelling, bruising, etc...)  Reviewed expectations re: course of current medical issues. Questions answered. Outlined signs and symptoms indicating need for more acute intervention. Patient verbalized understanding. After Visit Summary given.    Lestine Box, PA-C 03/11/20 1149

## 2020-03-26 ENCOUNTER — Encounter: Payer: Self-pay | Admitting: Internal Medicine

## 2020-04-02 ENCOUNTER — Encounter: Payer: Self-pay | Admitting: Internal Medicine

## 2020-05-10 NOTE — Progress Notes (Deleted)
Referring Provider: Nicholes Rough, PA-C Primary Care Physician:  [facility] Primary Gastroenterologist:  Dr. Abbey Chatters  No chief complaint on file.   HPI:   John Carroll is a 60 y.o. male presenting today at the request of Nicholes Rough, PA-C for consult colonoscopy.  Last colonoscopy in December 2018 with normal TI, 12 mm polyp in sigmoid colon with area tattooed, five 6-10 mm polyps, 2 2-4 mm polyps, diverticulosis in the rectosigmoid, sigmoid, and cecum, internal hemorrhoids.  Pathology with multiple tubular adenomas and 1 hyperplastic polyp.  Recommend repeat colonoscopy in 3 years, first-degree relatives need colonoscopy starting at age 21.  EGD also performed the same time with moderate Schatzki's ring, medium size hiatal hernia, gastritis s/p biopsy, duodenitis s/p biopsied.  Pathology with chronic gastritis, negative H. pylori, peptic duodenitis.  Recommended PPI twice daily x3 months then once daily indefinitely.  Patient has not been seen since procedures.  Reviewed recent labs completed 03/24/20 (Care Everywhere): CBC essentially normal other than WBC 13.4.  CMP remarkable for potassium 3.3, otherwise normal.  Today:   Past Medical History:  Diagnosis Date  . Acute renal failure (ARF) ([place]) 5/18, 11/18  . Chronic back pain   . Epididymitis   . GERD (gastroesophageal reflux disease)   . Hypertension    stopped medications due to low BPs  . Pancreatitis 08/2016    Past Surgical History:  Procedure Laterality Date  . BIOPSY  04/05/2017   Procedure: BIOPSY;  Surgeon: Danie Binder, MD;  [facility];  Service: Endoscopy;;  duodenum  . COLONOSCOPY WITH PROPOFOL N/A 04/05/2017   Procedure: COLONOSCOPY WITH PROPOFOL;  Surgeon: Danie Binder, MD;  [facility];  Service: Endoscopy;  Laterality: N/A;  8:30am  . ESOPHAGOGASTRODUODENOSCOPY (EGD) WITH PROPOFOL N/A 04/05/2017   Procedure: ESOPHAGOGASTRODUODENOSCOPY (EGD) WITH PROPOFOL;   Surgeon: Danie Binder, MD;  [facility];  Service: Endoscopy;  Laterality: N/A;  . POLYPECTOMY  04/05/2017   Procedure: POLYPECTOMY;  Surgeon: Danie Binder, MD;  [facility];  Service: Endoscopy;;  colon    Current Outpatient Medications  Medication Sig Dispense Refill  . acetaminophen (TYLENOL) 500 MG tablet Take 1 tablet (500 mg total) by mouth every 6 (six) hours as needed. 30 tablet 0  . feeding supplement (BOOST / RESOURCE BREEZE) LIQD Take 1 Container by mouth 3 (three) times daily between meals. 90 Container 0  . HYDROcodone-acetaminophen (NORCO/VICODIN) 5-325 MG tablet Take 1 tablet by mouth every 4 (four) hours as needed for severe pain. 15 tablet 0  . Multiple Vitamin (MULTIVITAMIN WITH MINERALS) TABS tablet Take 1 tablet by mouth daily.    . naproxen (NAPROSYN) 500 MG tablet Take 1 tablet (500 mg total) by mouth 2 (two) times daily. 30 tablet 0  . pantoprazole (PROTONIX) 40 MG tablet Take 1 tablet (40 mg total) by mouth 2 (two) times daily before a meal. 60 tablet 1  . potassium chloride (KLOR-CON) 10 MEQ tablet Take 1 tablet (10 mEq total) by mouth daily for 5 days. 5 tablet 0  . predniSONE (STERAPRED UNI-PAK 21 TAB) 10 MG (21) TBPK tablet Take by mouth daily. Take 6 tabs by mouth daily  for 2 days, then 5 tabs for 2 days, then 4 tabs for 2 days, then 3 tabs for 2 days, 2 tabs for 2 days, then 1 tab by mouth daily for 2 days 42 tablet 0   No current facility-administered medications for this visit.  Allergies as of 05/12/2020  . (No Known Allergies)    Family History  Problem Relation Age of Onset  . Kidney disease Mother   . Hypertension Mother   . Diabetes Mellitus II Mother   . Heart attack Father 66       Died of MI in 48  . Heart attack Sister   . Hypertension Sister     Social History   Socioeconomic History  . Marital status: Divorced    Spouse name: Not on file  . Number of children: Not on file  . Years of education: Not on  file  . Highest education level: Not on file  Occupational History  . Occupation: disabled  Tobacco Use  . Smoking status: Current Every Day Smoker    Packs/day: 1.00    Years: 27.00    Pack years: 27.00    Types: Cigarettes  . Smokeless tobacco: Never Used  Vaping Use  . Vaping Use: Never used  Substance and Sexual Activity  . Alcohol use: Yes    Comment: occasionally  . Drug use: Yes    Types: Marijuana    Comment: occasional use, last use maybe a couple of weeks ago  . Sexual activity: Not Currently    Birth control/protection: None  Other Topics Concern  . Not on file  Social History Narrative  . Not on file   Social Determinants of Health   Financial Resource Strain: Not on file  Food Insecurity: Not on file  Transportation Needs: Not on file  Physical Activity: Not on file  Stress: Not on file  Social Connections: Not on file  Intimate Partner Violence: Not on file    Review of Systems: Gen: Denies any fever, chills, fatigue, weight loss, lack of appetite.  CV: Denies chest pain, heart palpitations, peripheral edema, syncope.  Resp: Denies shortness of breath at rest or with exertion. Denies wheezing or cough.  GI: Denies dysphagia or odynophagia. Denies jaundice, hematemesis, fecal incontinence. GU : Denies urinary burning, urinary frequency, urinary hesitancy MS: Denies joint pain, muscle weakness, cramps, or limitation of movement.  Derm: Denies rash, itching, dry skin Psych: Denies depression, anxiety, memory loss, and confusion Heme: Denies bruising, bleeding, and enlarged lymph nodes.  Physical Exam: There were no vitals taken for this visit. General:   Alert and oriented. Pleasant and cooperative. Well-nourished and well-developed.  Head:  Normocephalic and atraumatic. Eyes:  Without icterus, sclera clear and conjunctiva pink.  Ears:  Normal auditory acuity. Nose:  No deformity, discharge,  or lesions. Mouth:  No deformity or lesions, oral mucosa  pink.  Neck:  Supple, without mass or thyromegaly. Lungs:  Clear to auscultation bilaterally. No wheezes, rales, or rhonchi. No distress.  Heart:  S1, S2 present without murmurs appreciated.  Abdomen:  +BS, soft, non-tender and non-distended. No HSM noted. No guarding or rebound. No masses appreciated.  Rectal:  Deferred  Msk:  Symmetrical without gross deformities. Normal posture. Pulses:  Normal pulses noted. Extremities:  Without clubbing or edema. Neurologic:  Alert and  oriented x4;  grossly normal neurologically. Skin:  Intact without significant lesions or rashes. Cervical Nodes:  No significant cervical adenopathy. Psych:  Alert and cooperative. Normal mood and affect.

## 2020-05-12 ENCOUNTER — Ambulatory Visit: Payer: Medicaid Other | Admitting: Gastroenterology

## 2020-06-23 ENCOUNTER — Encounter: Payer: Self-pay | Admitting: Gastroenterology

## 2020-06-23 NOTE — Progress Notes (Deleted)
Referring Provider:Beal, Barbera Setters, [place]- Primary Care Physician:  [facility], [facility] Primary Gastroenterologist:  Dr. Abbey Chatters  No chief complaint on file.   HPI:   John Carroll is a 60 y.o. male presenting today at the request of Nicholes Rough, PA-C to discuss scheduling surveillance colonoscopy.  Last colonoscopy December 2018 with 12 mm polyp in the sigmoid colon s/p complete resection and area tattooed, 5 additional polyps ranging 6-10 mm in size, and 2 polyps ranging 2-4 mm in size, multiple small largemouth diverticula in the rectosigmoid, sigmoid colon, and cecum, redundant rectosigmoid colon, internal hemorrhoids.  Pathology with tubular adenomas and 1 hyperplastic polyp.  Recommended repeat colonoscopy in 3 years.  Also had EGD in December 2018 due to anemia with moderate Schatzki's ring, medium size hiatal hernia, gastritis, and duodenitis.  Pathology with chronic gastritis and peptic duodenitis, no H. pylori.  Suspected anemia was secondary to gastro duodenitis and colon polyps.  Most recent labs on file in [place] completed November 2021 with hemoglobin 15.7.  We have not seen patient since 2018.  Today:   Past Medical History:  Diagnosis Date  . Acute renal failure (ARF) ([place]) 5/18, 11/18  . Chronic back pain   . Epididymitis   . GERD (gastroesophageal reflux disease)   . Hypertension    stopped medications due to low BPs  . Pancreatitis 08/2016    Past Surgical History:  Procedure Laterality Date  . BIOPSY  04/05/2017   Procedure: BIOPSY;  Surgeon: Danie Binder, MD;  [facility];  Service: Endoscopy;;  duodenum  . COLONOSCOPY WITH PROPOFOL N/A 04/05/2017   Procedure: COLONOSCOPY WITH PROPOFOL;  Surgeon: Danie Binder, MD;  [facility];  Service: Endoscopy;  Laterality: N/A;  8:30am  . ESOPHAGOGASTRODUODENOSCOPY (EGD) WITH PROPOFOL N/A 04/05/2017   Procedure: ESOPHAGOGASTRODUODENOSCOPY (EGD) WITH PROPOFOL;  Surgeon: Danie Binder, MD;  [facility];  Service: Endoscopy;  Laterality: N/A;  . POLYPECTOMY  04/05/2017   Procedure: POLYPECTOMY;  Surgeon: Danie Binder, MD;  [facility];  Service: Endoscopy;;  colon    Current Outpatient Medications  Medication Sig Dispense Refill  . acetaminophen (TYLENOL) 500 MG tablet Take 1 tablet (500 mg total) by mouth every 6 (six) hours as needed. 30 tablet 0  . feeding supplement (BOOST / RESOURCE BREEZE) LIQD Take 1 Container by mouth 3 (three) times daily between meals. 90 Container 0  . HYDROcodone-acetaminophen (NORCO/VICODIN) 5-325 MG tablet Take 1 tablet by mouth every 4 (four) hours as needed for severe pain. 15 tablet 0  . Multiple Vitamin (MULTIVITAMIN WITH MINERALS) TABS tablet Take 1 tablet by mouth daily.    . naproxen (NAPROSYN) 500 MG tablet Take 1 tablet (500 mg total) by mouth 2 (two) times daily. 30 tablet 0  . pantoprazole (PROTONIX) 40 MG tablet Take 1 tablet (40 mg total) by mouth 2 (two) times daily before a meal. 60 tablet 1  . potassium chloride (KLOR-CON) 10 MEQ tablet Take 1 tablet (10 mEq total) by mouth daily for 5 days. 5 tablet 0  . predniSONE (STERAPRED UNI-PAK 21 TAB) 10 MG (21) TBPK tablet Take by mouth daily. Take 6 tabs by mouth daily  for 2 days, then 5 tabs for 2 days, then 4 tabs for 2 days, then 3 tabs for 2 days, 2 tabs for 2 days, then 1 tab by mouth daily for 2 days 42 tablet 0   No current facility-administered medications for this visit.  Allergies as of 06/25/2020  . (No Known Allergies)    Family History  Problem Relation Age of Onset  . Kidney disease Mother   . Hypertension Mother   . Diabetes Mellitus II Mother   . Heart attack Father 50       Died of MI in 44  . Heart attack Sister   . Hypertension Sister     Social History   Socioeconomic History  . Marital status: Divorced    Spouse name: Not on file  . Number of children: Not on file  . Years of education: Not on file  . Highest  education level: Not on file  Occupational History  . Occupation: disabled  Tobacco Use  . Smoking status: Current Every Day Smoker    Packs/day: 1.00    Years: 27.00    Pack years: 27.00    Types: Cigarettes  . Smokeless tobacco: Never Used  Vaping Use  . Vaping Use: Never used  Substance and Sexual Activity  . Alcohol use: Yes    Comment: occasionally  . Drug use: Yes    Types: Marijuana    Comment: occasional use, last use maybe a couple of weeks ago  . Sexual activity: Not Currently    Birth control/protection: None  Other Topics Concern  . Not on file  Social History Narrative  . Not on file   Social Determinants of Health   Financial Resource Strain: Not on file  Food Insecurity: Not on file  Transportation Needs: Not on file  Physical Activity: Not on file  Stress: Not on file  Social Connections: Not on file  Intimate Partner Violence: Not on file    Review of Systems: Gen: Denies any fever, chills, fatigue, weight loss, lack of appetite.  CV: Denies chest pain, heart palpitations, peripheral edema, syncope.  Resp: Denies shortness of breath at rest or with exertion. Denies wheezing or cough.  GI: Denies dysphagia or odynophagia. Denies jaundice, hematemesis, fecal incontinence. GU : Denies urinary burning, urinary frequency, urinary hesitancy MS: Denies joint pain, muscle weakness, cramps, or limitation of movement.  Derm: Denies rash, itching, dry skin Psych: Denies depression, anxiety, memory loss, and confusion Heme: Denies bruising, bleeding, and enlarged lymph nodes.  Physical Exam: There were no vitals taken for this visit. General:   Alert and oriented. Pleasant and cooperative. Well-nourished and well-developed.  Head:  Normocephalic and atraumatic. Eyes:  Without icterus, sclera clear and conjunctiva pink.  Ears:  Normal auditory acuity. Nose:  No deformity, discharge,  or lesions. Mouth:  No deformity or lesions, oral mucosa pink.  Neck:   Supple, without mass or thyromegaly. Lungs:  Clear to auscultation bilaterally. No wheezes, rales, or rhonchi. No distress.  Heart:  S1, S2 present without murmurs appreciated.  Abdomen:  +BS, soft, non-tender and non-distended. No HSM noted. No guarding or rebound. No masses appreciated.  Rectal:  Deferred  Msk:  Symmetrical without gross deformities. Normal posture. Pulses:  Normal pulses noted. Extremities:  Without clubbing or edema. Neurologic:  Alert and  oriented x4;  grossly normal neurologically. Skin:  Intact without significant lesions or rashes. Cervical Nodes:  No significant cervical adenopathy. Psych:  Alert and cooperative. Normal mood and affect.

## 2020-06-25 ENCOUNTER — Encounter: Payer: Self-pay | Admitting: Internal Medicine

## 2020-06-25 ENCOUNTER — Ambulatory Visit: Payer: Medicaid Other | Admitting: Gastroenterology

## 2020-09-13 ENCOUNTER — Ambulatory Visit
Admission: EM | Admit: 2020-09-13 | Discharge: 2020-09-13 | Disposition: A | Discharge status: Home / Self Care | Payer: Medicaid Other | Attending: Family Medicine | Admitting: Family Medicine

## 2020-09-13 ENCOUNTER — Encounter: Payer: Self-pay | Admitting: Emergency Medicine

## 2020-09-13 ENCOUNTER — Other Ambulatory Visit: Payer: Self-pay

## 2020-09-13 DIAGNOSIS — M109 Gout, unspecified: Secondary | ICD-10-CM | POA: Diagnosis not present

## 2020-09-13 HISTORY — DX: Gout, unspecified: M10.9

## 2020-09-13 MED ORDER — DEXAMETHASONE SODIUM PHOSPHATE 10 MG/ML IJ SOLN
10.0000 mg | Freq: Once | INTRAMUSCULAR | Status: AC
  Administered 2020-09-13: 10 mg via INTRAMUSCULAR

## 2020-09-13 MED ORDER — PREDNISONE 20 MG PO TABS: 40.0000 mg | ORAL_TABLET | Freq: Every day | ORAL | 0 refills | Status: AC

## 2020-09-13 NOTE — Discharge Instructions (Addendum)
I have sent in prednisone for you to take 2 tablets daily in the morning for 5 days.  Follow up with this office or with primary care if symptoms are persisting.  Follow up in the ER for high fever, trouble swallowing, trouble breathing, other concerning symptoms.

## 2020-09-13 NOTE — ED Triage Notes (Signed)
States he has a gout flare up in his left foot.  States he noticed it yesterday.

## 2020-09-13 NOTE — ED Provider Notes (Signed)
[facility]    CSN: [id number] Arrival date & time: 09/13/20  1233      History   Chief Complaint No chief complaint on file.   HPI John Carroll is a 60 y.o. male.   Reports that he thinks he is having a gout flare to the base of his left great toe.  States that symptoms began yesterday.  Reports the area is red, swollen, painful to touch and painful to walk on.  Has not attempted treatment at home this time.  States that he has an appointment with his PCP on Wednesday, but did not want a wait that long before treating his gout.  Denies chills, fever, radiating pain, abdominal pain, nausea, vomiting, diarrhea, rash, other symptoms.  ROS per HPI  The history is provided by the patient.    Past Medical History:  Diagnosis Date  . Acute renal failure (ARF) ([place]) 5/18, 11/18  . Chronic back pain   . Epididymitis   . GERD (gastroesophageal reflux disease)   . Gout   . Hypertension    stopped medications due to low BPs  . Pancreatitis 08/2016   idiopathic  . 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 Normocytic anemia   . Failure to thrive in adult 03/24/2017  . Hypotension due to hypovolemia   . Unexplained weight loss 03/23/2017  . Pancreatitis 09/23/2016  . Marijuana abuse 09/23/2016  . Tobacco abuse 09/23/2016  . Hyperglycemia 09/23/2016  . AKI (acute kidney injury) ([place]) 03/29/2016  . Acute renal failure (ARF) ([place]) 03/28/2016  . Hyperkalemia 03/28/2016  . Hypertension 03/28/2016  . Hyponatremia 03/28/2016    Past Surgical History:  Procedure Laterality Date  . BIOPSY  04/05/2017   Procedure: BIOPSY;  Surgeon: Danie Binder, MD;  [facility];  Service: Endoscopy;;  duodenum  . COLONOSCOPY WITH PROPOFOL N/A 04/05/2017   Procedure: COLONOSCOPY WITH PROPOFOL;  Surgeon: Danie Binder, MD;  [facility];  Service: Endoscopy;  Laterality: N/A;  8:30am  .  ESOPHAGOGASTRODUODENOSCOPY (EGD) WITH PROPOFOL N/A 04/05/2017   Procedure: ESOPHAGOGASTRODUODENOSCOPY (EGD) WITH PROPOFOL;  Surgeon: Danie Binder, MD;  [facility];  Service: Endoscopy;  Laterality: N/A;  . POLYPECTOMY  04/05/2017   Procedure: POLYPECTOMY;  Surgeon: Danie Binder, MD;  [facility];  Service: Endoscopy;;  colon       Home Medications    Prior to Admission medications   Medication Sig Start Date End Date Taking? Authorizing Provider  predniSONE (DELTASONE) 20 MG tablet Take 2 tablets (40 mg total) by mouth daily with breakfast for 5 days. 09/13/20 09/18/20 Yes Faustino Congress, NP  acetaminophen (TYLENOL) 500 MG tablet Take 1 tablet (500 mg total) by mouth every 6 (six) hours as needed. 01/25/20   Avegno, Darrelyn Hillock, FNP  feeding supplement (BOOST / RESOURCE BREEZE) LIQD Take 1 Container by mouth 3 (three) times daily between meals. 03/27/17   Orson Eva, MD  HYDROcodone-acetaminophen (NORCO/VICODIN) 5-325 MG tablet Take 1 tablet by mouth every 4 (four) hours as needed for severe pain. 04/30/19   Eustaquio Maize, PA-C  Multiple Vitamin (MULTIVITAMIN WITH MINERALS) TABS tablet Take 1 tablet by mouth daily.    [provider]  naproxen (NAPROSYN) 500 MG tablet Take 1 tablet (500 mg total) by mouth 2 (two) times daily. 03/08/18   Henderly, Britni A, PA-C  pantoprazole (PROTONIX) 40 MG tablet Take 1  tablet (40 mg total) by mouth 2 (two) times daily before a meal. 03/27/17   Tat, Shanon Brow, MD  potassium chloride (KLOR-CON) 10 MEQ tablet Take 1 tablet (10 mEq total) by mouth daily for 5 days. 04/30/19 05/05/19  Eustaquio Maize, PA-C    Family History Family History  Problem Relation Age of Onset  . Kidney disease Mother   . Hypertension Mother   . Diabetes Mellitus II Mother   . Heart attack Father 110       Died of MI in 61  . Heart attack Sister   . Hypertension Sister     Social History Social History   Tobacco Use  . Smoking status:  Current Every Day Smoker    Packs/day: 1.00    Years: 27.00    Pack years: 27.00    Types: Cigarettes  . Smokeless tobacco: Never Used  Vaping Use  . Vaping Use: Never used  Substance Use Topics  . Alcohol use: Yes    Comment: occasionally  . Drug use: Yes    Types: Marijuana    Comment: occasional use, last use maybe a couple of weeks ago     Allergies   Patient has no known allergies.   Review of Systems Review of Systems   Physical Exam Triage Vital Signs ED Triage Vitals  Enc Vitals Group     BP 09/13/20 1310 123/82     Pulse Rate 09/13/20 1310 85     Resp 09/13/20 1310 18     Temp 09/13/20 1310 98 F (36.7 C)     Temp Source 09/13/20 1310 Oral     SpO2 09/13/20 1310 94 %     Weight --      Height --      Head Circumference --      Peak Flow --      Pain Score 09/13/20 1311 3     Pain Loc --      Pain Edu? --      Excl. in [place]? --    No data found.  Updated Vital Signs BP 123/82 (BP Location: Right Arm)   Pulse 85   Temp 98 F (36.7 C) (Oral)   Resp 18   SpO2 94%    Physical Exam Vitals and nursing note reviewed.  Constitutional:      General: He is not in acute distress.    Appearance: Normal appearance. He is well-developed and normal weight. He is not ill-appearing.  HENT:     Head: Normocephalic and atraumatic.     Nose: Nose normal.     Mouth/Throat:     Mouth: Mucous membranes are moist.     Pharynx: Oropharynx is clear.  Eyes:     Extraocular Movements: Extraocular movements intact.     Conjunctiva/sclera: Conjunctivae normal.     Pupils: Pupils are equal, round, and reactive to light.  Cardiovascular:     Rate and Rhythm: Normal rate.  Pulmonary:     Effort: Pulmonary effort is normal.  Musculoskeletal:        General: Swelling and tenderness present.     Cervical back: Normal range of motion and neck supple.     Comments: Erythema, swelling, tenderness to the base of the left great toe, no drainage noted  Skin:    General:  Skin is warm and dry.     Capillary Refill: Capillary refill takes less than 2 seconds.  Neurological:     General: No focal deficit present.  Mental Status: He is alert.  Psychiatric:        Mood and Affect: Mood normal.        Behavior: Behavior normal.        Thought Content: Thought content normal.      UC Treatments / Results  Labs (all labs ordered are listed, but only abnormal results are displayed) Labs Reviewed - No data to display  EKG   Radiology No results found.  Procedures Procedures (including critical care time)  Medications Ordered in UC Medications  dexamethasone (DECADRON) injection 10 mg (10 mg Intramuscular Given 09/13/20 1347)    Initial Impression / Assessment and Plan / UC Course  I have reviewed the triage vital signs and the nursing notes.  Pertinent labs & imaging results that were available during my care of the patient were reviewed by me and considered in my medical decision making (see chart for details).    Gout flare of left toe  Decadron 10 mg given IM in office today Prednisone prescribed 40 mg daily x5 days Follow-up with PCP as scheduled  Final Clinical Impressions(s) / UC Diagnoses   Final diagnoses:  Acute gout involving toe of left foot, unspecified cause     Discharge Instructions     I have sent in prednisone for you to take 2 tablets daily in the morning for 5 days.  Follow up with this office or with primary care if symptoms are persisting.  Follow up in the ER for high fever, trouble swallowing, trouble breathing, other concerning symptoms.     ED Prescriptions    Medication Sig Dispense Auth. Provider   predniSONE (DELTASONE) 20 MG tablet Take 2 tablets (40 mg total) by mouth daily with breakfast for 5 days. 10 tablet Faustino Congress, NP     PDMP not reviewed this encounter.   Faustino Congress, NP 09/13/20 1400

## 2020-12-15 ENCOUNTER — Encounter: Payer: Self-pay | Admitting: Emergency Medicine

## 2020-12-15 ENCOUNTER — Ambulatory Visit
Admission: EM | Admit: 2020-12-15 | Discharge: 2020-12-15 | Disposition: A | Discharge status: Home / Self Care | Payer: Medicaid Other | Attending: Family Medicine | Admitting: Family Medicine

## 2020-12-15 ENCOUNTER — Other Ambulatory Visit: Payer: Self-pay

## 2020-12-15 DIAGNOSIS — W57XXXA Bitten or stung by nonvenomous insect and other nonvenomous arthropods, initial encounter: Secondary | ICD-10-CM

## 2020-12-15 DIAGNOSIS — S40862A Insect bite (nonvenomous) of left upper arm, initial encounter: Secondary | ICD-10-CM

## 2020-12-15 DIAGNOSIS — L03114 Cellulitis of left upper limb: Secondary | ICD-10-CM | POA: Diagnosis not present

## 2020-12-15 MED ORDER — TRIAMCINOLONE ACETONIDE 0.025 % EX OINT: 1.0000 "application " | TOPICAL_OINTMENT | Freq: Two times a day (BID) | CUTANEOUS | 0 refills | Status: DC

## 2020-12-15 MED ORDER — DOXYCYCLINE HYCLATE 100 MG PO CAPS: 100.0000 mg | ORAL_CAPSULE | Freq: Two times a day (BID) | ORAL | 0 refills | Status: AC

## 2020-12-15 NOTE — ED Provider Notes (Signed)
[facility]    CSN: [id number] Arrival date & time: 12/15/20  1519      History   Chief Complaint Chief Complaint  Patient presents with   Insect Bite    HPI John Carroll is a 60 y.o. male.   HPI Patient presents today for evaluation of an infected insect bite localized to his left elbow x3 days.  He reports that the bite wound has become increasingly red, itchy and redness is now expanded beyond the site of the original insect bite.  He denies any nausea vomiting or fever. 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     Patient Active Problem List   Diagnosis Date Noted   Normocytic anemia    Failure to thrive in adult 03/24/2017   Hypotension due to hypovolemia    Unexplained weight loss 03/23/2017   Pancreatitis 09/23/2016   Marijuana abuse 09/23/2016   Tobacco abuse 09/23/2016   Hyperglycemia 09/23/2016   AKI (acute kidney injury) ([place]) 03/29/2016   Acute renal failure (ARF) ([place]) 03/28/2016   Hyperkalemia 03/28/2016   Hypertension 03/28/2016   Hyponatremia 03/28/2016    Past Surgical History:  Procedure Laterality Date   BIOPSY  04/05/2017   Procedure: BIOPSY;  Surgeon: Danie Binder, MD;  [facility];  Service: Endoscopy;;  duodenum   COLONOSCOPY WITH PROPOFOL N/A 04/05/2017   Procedure: COLONOSCOPY WITH PROPOFOL;  Surgeon: Danie Binder, MD;  [facility];  Service: Endoscopy;  Laterality: N/A;  8:30am   ESOPHAGOGASTRODUODENOSCOPY (EGD) WITH PROPOFOL N/A 04/05/2017   Procedure: ESOPHAGOGASTRODUODENOSCOPY (EGD) WITH PROPOFOL;  Surgeon: Danie Binder, MD;  [facility];  Service: Endoscopy;  Laterality: N/A;   POLYPECTOMY  04/05/2017   Procedure:  POLYPECTOMY;  Surgeon: Danie Binder, MD;  [facility];  Service: Endoscopy;;  colon       Home Medications    Prior to Admission medications   Medication Sig Start Date End Date Taking? Authorizing Provider  doxycycline (VIBRAMYCIN) 100 MG capsule Take 1 capsule (100 mg total) by mouth 2 (two) times daily for 7 days. 12/15/20 12/22/20 Yes Scot Jun, FNP  triamcinolone (KENALOG) 0.025 % ointment Apply 1 application topically 2 (two) times daily. 12/15/20  Yes Scot Jun, FNP  acetaminophen (TYLENOL) 500 MG tablet Take 1 tablet (500 mg total) by mouth every 6 (six) hours as needed. 01/25/20   Avegno, Darrelyn Hillock, FNP  feeding supplement (BOOST / RESOURCE BREEZE) LIQD Take 1 Container by mouth 3 (three) times daily between meals. 03/27/17   Orson Eva, MD  HYDROcodone-acetaminophen (NORCO/VICODIN) 5-325 MG tablet Take 1 tablet by mouth every 4 (four) hours as needed for severe pain. 04/30/19   Eustaquio Maize, PA-C  Multiple Vitamin (MULTIVITAMIN WITH MINERALS) TABS tablet Take 1 tablet by mouth daily.    [provider]  naproxen (NAPROSYN) 500 MG tablet Take 1 tablet (500 mg total) by mouth 2 (two) times daily. 03/08/18   Henderly, Britni A, PA-C  pantoprazole (PROTONIX) 40 MG tablet Take 1 tablet (40 mg total) by mouth 2 (two) times daily before a meal. 03/27/17   Orson Eva, MD  potassium  chloride (KLOR-CON) 10 MEQ tablet Take 1 tablet (10 mEq total) by mouth daily for 5 days. 04/30/19 05/05/19  Eustaquio Maize, PA-C    Family History Family History  Problem Relation Age of Onset   Kidney disease Mother    Hypertension Mother    Diabetes Mellitus II Mother    Heart attack Father 44       Died of MI in [place] attack Sister    Hypertension Sister     Social History Social History   Tobacco Use   Smoking status: Every Day    Packs/day: 1.00    Years: 27.00    Pack years: 27.00    Types: Cigarettes   Smokeless tobacco: Never  Vaping Use    Vaping Use: Never used  Substance Use Topics   Alcohol use: Yes    Comment: occasionally   Drug use: Yes    Types: Marijuana    Comment: occasional use, last use maybe a couple of weeks ago     Allergies   Patient has no known allergies.   Review of Systems Review of Systems Pertinent negatives listed in HPI  Physical Exam Triage Vital Signs ED Triage Vitals  Enc Vitals Group     BP 12/15/20 1819 (!) 152/91     Pulse Rate 12/15/20 1819 86     Resp 12/15/20 1819 16     Temp 12/15/20 1819 97.8 F (36.6 C)     Temp Source 12/15/20 1819 Tympanic     SpO2 12/15/20 1819 93 %     Weight --      Height --      Head Circumference --      Peak Flow --      Pain Score 12/15/20 1831 0     Pain Loc --      Pain Edu? --      Excl. in [place]? --    No data found.  Updated Vital Signs BP (!) 152/91 (BP Location: Right Arm)   Pulse 86   Temp 97.8 F (36.6 C) (Tympanic)   Resp 16   SpO2 93%   Visual Acuity Right Eye Distance:   Left Eye Distance:   Bilateral Distance:    Right Eye Near:   Left Eye Near:    Bilateral Near:     Physical Exam General appearance: Alert, well developed, well nourished, cooperative  Head: Normocephalic, without obvious abnormality, atraumatic Respiratory: Respirations even and unlabored, normal respiratory rate Heart: Rate and Rhythm normal. No gallop or murmurs noted on exam  Extremities: left elbow erythematous and swollen with erthymetous streaks present No gross deformities Skin: Skin color, texture, turgor normal. No rashes seen  Psych: Appropriate mood and affect. Neurologic: GCS 15, normal coordination, normal gait  UC Treatments / Results  Labs (all labs ordered are listed, but only abnormal results are displayed) Labs Reviewed - No data to display  EKG   Radiology No results found.  Procedures Procedures (including critical care time)  Medications Ordered in UC Medications - No data to display  Initial Impression /  Assessment and Plan / UC Course  I have reviewed the triage vital signs and the nursing notes.  Pertinent labs & imaging results that were available during my care of the patient were reviewed by me and considered in my medical decision making (see chart for details).    Tick bite left upper arm with subsequent cellulitis infection  Treatment per discharge medication orders. Strict red flag  precautions given. Follow-up here with PCP as needed. Final Clinical Impressions(s) / UC Diagnoses   Final diagnoses:  Tick bite of left upper arm, initial encounter  Cellulitis of left upper extremity   Discharge Instructions   None    ED Prescriptions     Medication Sig Dispense Auth. Provider   doxycycline (VIBRAMYCIN) 100 MG capsule Take 1 capsule (100 mg total) by mouth 2 (two) times daily for 7 days. 14 capsule Scot Jun, FNP   triamcinolone (KENALOG) 0.025 % ointment Apply 1 application topically 2 (two) times daily. 30 g Scot Jun, FNP      PDMP not reviewed this encounter.   Scot Jun, FNP 12/22/20 [phone]

## 2020-12-15 NOTE — ED Triage Notes (Signed)
Insect bite to LT elbow since Friday .  Area is red itchy and swollen

## 2021-01-14 ENCOUNTER — Encounter: Payer: Self-pay | Admitting: Internal Medicine

## 2021-06-05 ENCOUNTER — Ambulatory Visit: Payer: Medicaid Other | Admitting: Gastroenterology

## 2021-06-05 ENCOUNTER — Encounter: Payer: Self-pay | Admitting: Gastroenterology

## 2021-06-05 ENCOUNTER — Other Ambulatory Visit: Payer: Self-pay

## 2021-06-05 DIAGNOSIS — Z86010 Personal history of colonic polyps: Secondary | ICD-10-CM

## 2021-06-05 IMAGING — CT CT ABD-PELV W/ CM
2 of 5 series · 15 of 46 positions shown, 17 images · IV contrast (Omnipaque or Isovue)
Comparison: CT 03/23/2017

CLINICAL DATA: RIGHT flank pain for several days. History of
pancreatitis.

EXAM:
CT ABDOMEN AND PELVIS WITH CONTRAST
TECHNIQUE: Multidetector CT imaging of the abdomen and pelvis was performed
using the standard protocol following bolus administration of
intravenous contrast.
CONTRAST:  80mL OMNIPAQUE IOHEXOL 300 MG/ML  SOLN

[Series 2: axial st · axial · 0.81mm/px · z∈[-801,-436]mm · 12 of 87 slices shown, 14 images]
[im 7/87  soft-tissue]
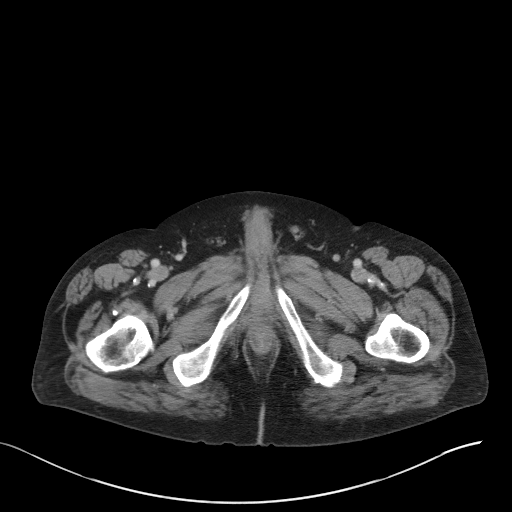
[im 7/87  bone]
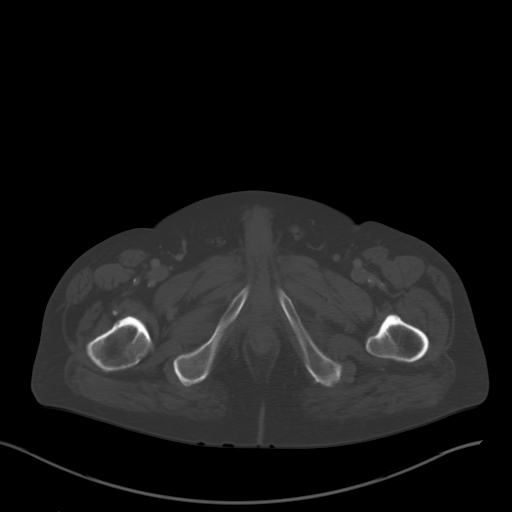
[im 14/87  soft-tissue]
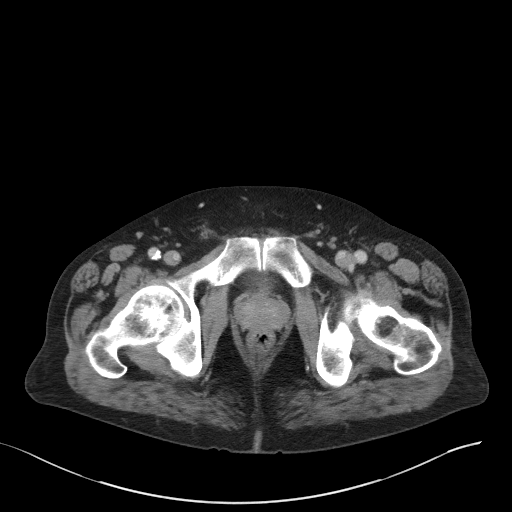
[im 20/87  soft-tissue]
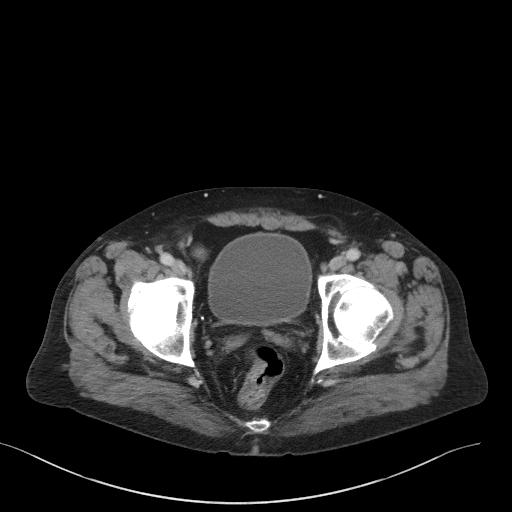
[im 27/87  soft-tissue]
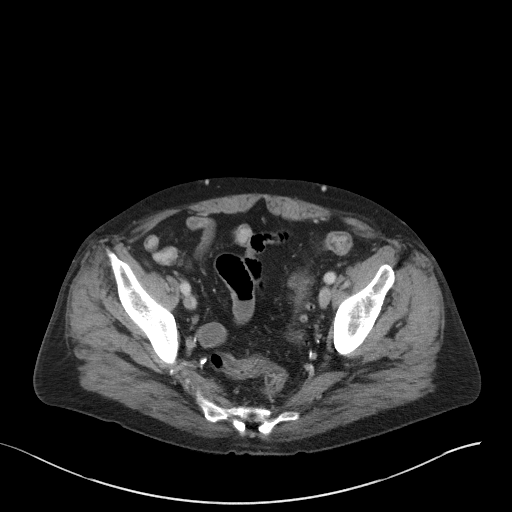
[im 34/87  soft-tissue]
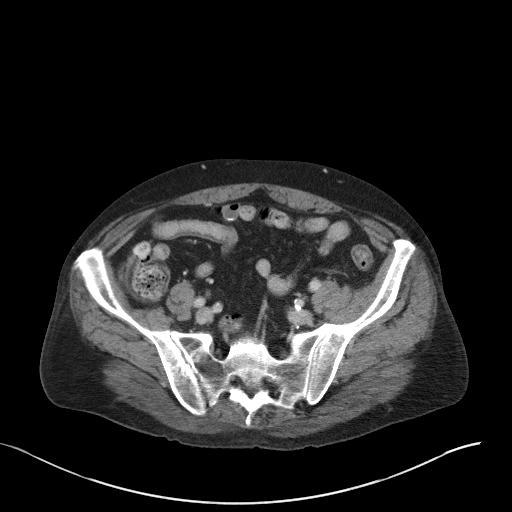
[im 40/87  soft-tissue]
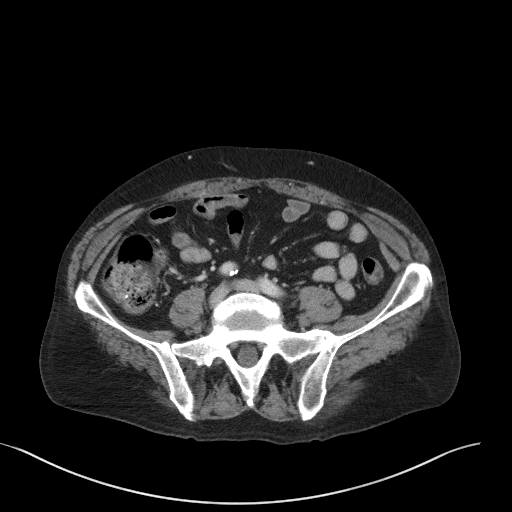
[im 47/87  soft-tissue]
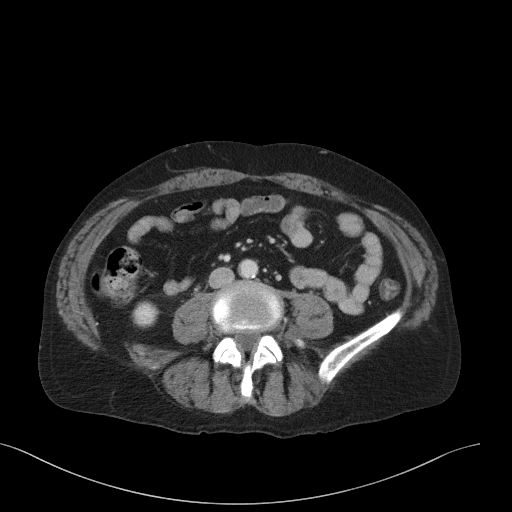
[im 53/87  soft-tissue]
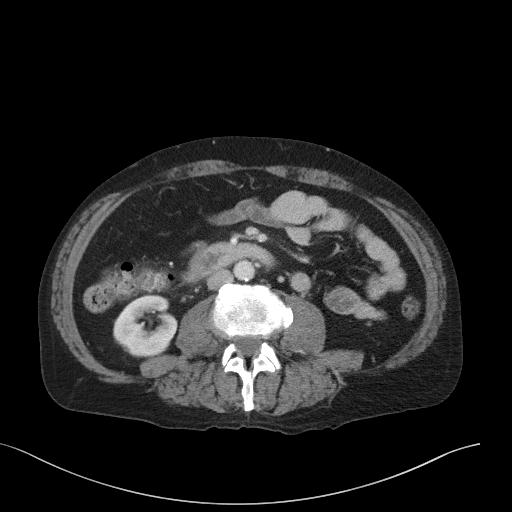
[im 60/87  soft-tissue]
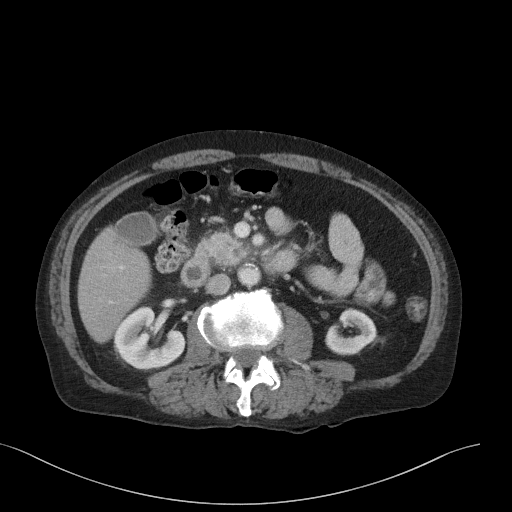
[im 60/87  bone]
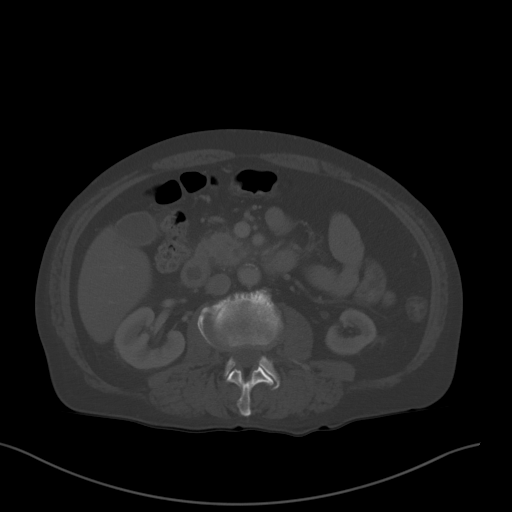
[im 67/87  soft-tissue]
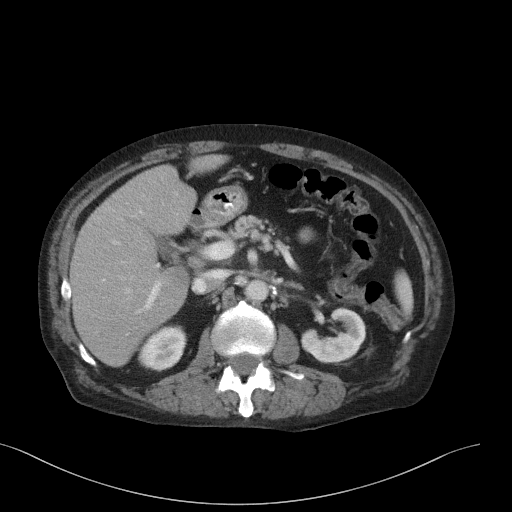
[im 73/87  soft-tissue]
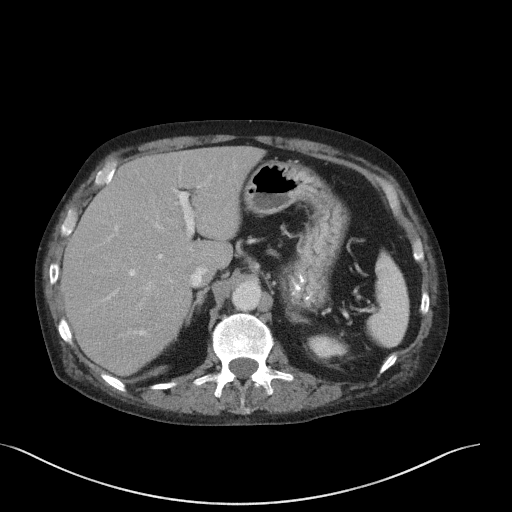
[im 80/87  soft-tissue]
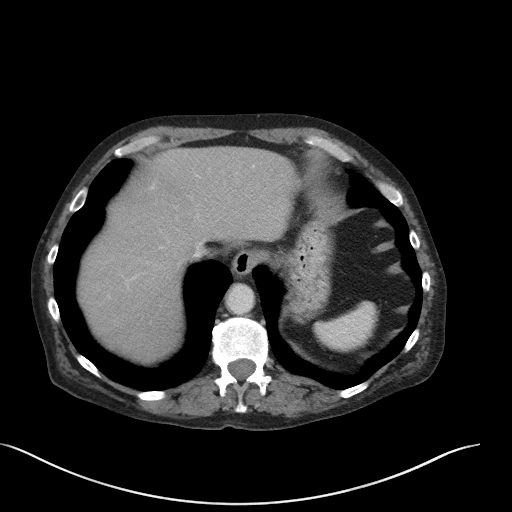

[Series 5: coronal st · coronal · 0.74mm/px · 3 of 99 slices shown]
[im 33/99  soft-tissue]
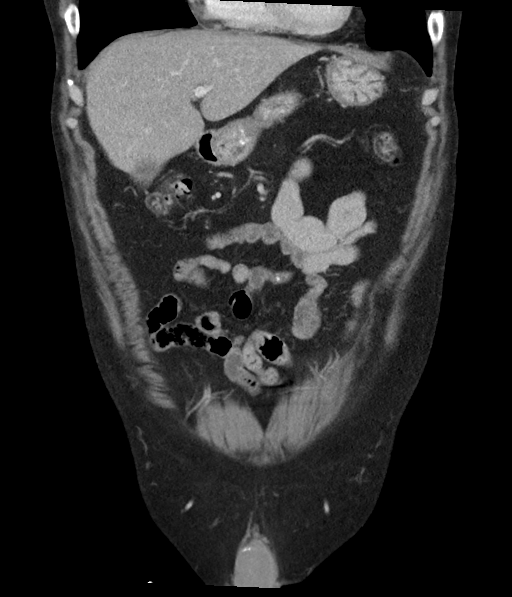
[im 44/99  soft-tissue]
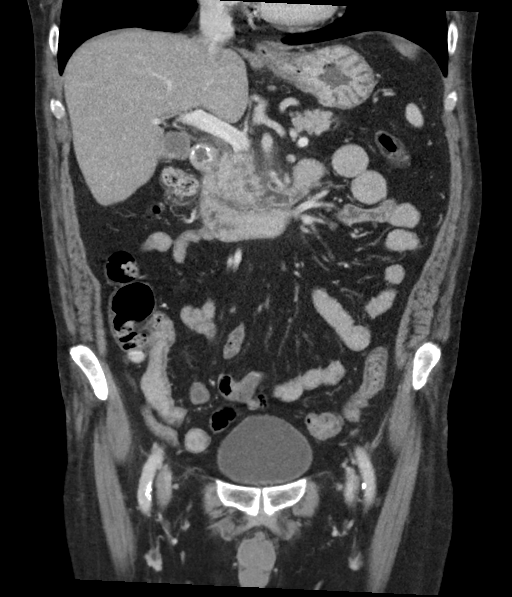
[im 55/99  soft-tissue]
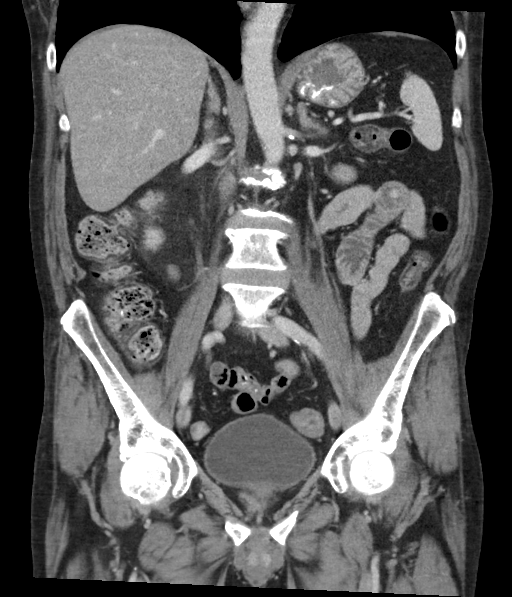

[15 of 46 positions shown; findings below may reference images not displayed]

FINDINGS: Lower chest: Mild peribronchial thickening in the medial LEFT lower
lobe.

Hepatobiliary: No focal hepatic lesion. No biliary duct dilatation.
Gallbladder is normal. Common bile duct is normal.

Pancreas: There is mild hazy inflammation in the retroperitoneal fat
adjacent to the head of the pancreas. No dilatation of the common
bile duct. No dilatation of the pancreatic duct. The body and tail
the pancreas are normal.

Spleen: Normal spleen

Adrenals/urinary tract: Adrenal glands and kidneys are normal. The
ureters and bladder normal.

Stomach/Bowel: Stomach, small bowel, appendix, and cecum are normal.
The colon and rectosigmoid colon are normal.

Vascular/Lymphatic: Abdominal aorta is normal caliber with
atherosclerotic calcification. There is no retroperitoneal or
periportal lymphadenopathy. No pelvic lymphadenopathy.

Reproductive: Prostate normal

Other: No free fluid.

Musculoskeletal: No aggressive osseous lesion.
IMPRESSION: 1. Mild inflammation of the pancreatic head consistent with focal
pancreatitis.
2. No biliary or pancreatic duct dilatation.
3. No cholelithiasis evident by CT imaging.
4. Mild inflammation or infection in the medial LEFT lower lobe of
the lung.

## 2021-06-05 NOTE — Patient Instructions (Signed)
Colonoscopy in the near future.  Separate instructions to be provided.

## 2021-06-05 NOTE — Progress Notes (Signed)
Primary Care Physician:  Nicholes Rough, PA-C  Primary Gastroenterologist:    Chief Complaint  Patient presents with   Colonoscopy    HPI:  John Carroll is a 61 y.o. male here to schedule colonoscopy at the request of Nicholes Rough, PA-C.   He is overdue for surveillance colonoscopy. Last colonoscopy 04/2017, numerous tubular adenomas removed, one measuring 13mm in the sigmoid colon was also tattooed. He was due for surveillance in 2021. He also completed EGD at that time for anemia and was noted to have moderate sized hiatal hernia, schatzki ring, gastroduodenitis. He had peptic duodenitis. Gastritis but no h.pylori.   Today: BM regular, couple of stools a day, usually postprandial. No melena. No brbpr. No abdominal pain. No dysphagia. No heartburn. No vomiting.   Labs from 04/2021: WBC 9200, Hgb 15.8, MCV 99, Platelets 262000. BUN 98, Cre 1.23, sodium 140, alb 4.4, Tbili 0.6, AP 136, AST 29, ALT 21. Glucose 98.  Current Outpatient Medications  Medication Sig Dispense Refill   acetaminophen (TYLENOL) 500 MG tablet Take 1 tablet (500 mg total) by mouth every 6 (six) hours as needed. 30 tablet 0   allopurinol (ZYLOPRIM) 100 MG tablet Take 100 mg by mouth daily.     ALPRAZolam (XANAX) 0.5 MG tablet Take 0.5 mg by mouth at bedtime as needed for anxiety.     colchicine 0.6 MG tablet Take 0.6 mg by mouth daily.     feeding supplement (BOOST / RESOURCE BREEZE) LIQD Take 1 Container by mouth 3 (three) times daily between meals. 90 Container 0   lisinopril-hydrochlorothiazide (ZESTORETIC) 20-12.5 MG tablet Take 1 tablet by mouth daily.     Multiple Vitamin (MULTIVITAMIN WITH MINERALS) TABS tablet Take 1 tablet by mouth daily.     potassium chloride (KLOR-CON) 10 MEQ tablet Take 1 tablet (10 mEq total) by mouth daily for 5 days. 5 tablet 0   triamcinolone (KENALOG) 0.025 % ointment Apply 1 application topically 2 (two) times daily. (Patient not taking: Reported on 06/05/2021) 30 g 0   No current  facility-administered medications for this visit.    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     Past Surgical History:  Procedure Laterality Date   BIOPSY  04/05/2017   Procedure: BIOPSY;  Surgeon: Danie Binder, MD;  [facility];  Service: Endoscopy;;  duodenum   COLONOSCOPY WITH PROPOFOL N/A 04/05/2017   Procedure: COLONOSCOPY WITH PROPOFOL;  Surgeon: Danie Binder, MD;  [facility];  Service: Endoscopy;  Laterality: N/A;  8:30am   ESOPHAGOGASTRODUODENOSCOPY (EGD) WITH PROPOFOL N/A 04/05/2017   Procedure: ESOPHAGOGASTRODUODENOSCOPY (EGD) WITH PROPOFOL;  Surgeon: Danie Binder, MD;  [facility];  Service: Endoscopy;  Laterality: N/A;   POLYPECTOMY  04/05/2017   Procedure: POLYPECTOMY;  Surgeon: Danie Binder, MD;  [facility];  Service: Endoscopy;;  colon    Family History  Problem Relation Age of Onset   Kidney disease Mother    Hypertension Mother    Diabetes Mellitus II Mother    Heart attack Father 63  Died of MI in [place] attack Sister 107       open heart surgery, remot.   Hypertension Sister    Colon cancer Neg Hx     Social History   Socioeconomic History   Marital status: Divorced    Spouse name: Not on file   Number of children: Not on file   Years of education: Not on file   Highest education level: Not on file  Occupational History   Occupation: disabled  Tobacco Use   Smoking status: Every Day    Packs/day: 0.50    Years: 27.00    Pack years: 13.50    Types: Cigarettes   Smokeless tobacco: Never  Vaping Use   Vaping Use: Never used  Substance and Sexual Activity    Alcohol use: Yes    Comment: occasionally, couple times per year. Used to drink real heavy years ago.(06/05/21)   Drug use: Yes    Types: Marijuana    Comment: occasional use, last use maybe a couple of weeks ago   Sexual activity: Not Currently    Birth control/protection: None  Other Topics Concern   Not on file  Social History Narrative   Not on file   Social Determinants of Health   Financial Resource Strain: Not on file  Food Insecurity: Not on file  Transportation Needs: Not on file  Physical Activity: Not on file  Stress: Not on file  Social Connections: Not on file  Intimate Partner Violence: Not on file      ROS:  General: Negative for anorexia, weight loss, fever, chills, fatigue, weakness. Eyes: Negative for vision changes.  ENT: Negative for hoarseness, difficulty swallowing , nasal congestion. CV: Negative for chest pain, angina, palpitations, dyspnea on exertion, peripheral edema.  Respiratory: Negative for dyspnea at rest, dyspnea on exertion, cough, sputum, wheezing.  GI: See history of present illness. GU:  Negative for dysuria, hematuria, urinary incontinence, urinary frequency, nocturnal urination.  MS: Negative for joint pain, low back pain.  Derm: Negative for rash or itching.  Neuro: Negative for weakness, abnormal sensation, seizure, frequent headaches, memory loss, confusion.  Psych: Negative for depression, suicidal ideation, hallucinations. +anxiety Endo: Negative for unusual weight change.  Heme: Negative for bruising or bleeding. Allergy: Negative for rash or hives.    Physical Examination:  BP (!) 148/99    Pulse 97    Temp 97.7 F (36.5 C)    Ht 5\' 8"  (1.727 m)    Wt 168 lb 12.8 oz (76.6 kg)    BMI 25.67 kg/m    General: Well-nourished, well-developed in no acute distress.  Head: Normocephalic, atraumatic.   Eyes: Conjunctiva pink, no icterus. Mouth: masked. Neck: Supple without thyromegaly, masses, or lymphadenopathy.  Lungs: Clear to  auscultation bilaterally.  Heart: Regular rate and rhythm, no murmurs rubs or gallops.  Abdomen: Bowel sounds are normal, nontender, nondistended, no hepatosplenomegaly or masses, no abdominal bruits or    hernia , no rebound or guarding.   Rectal: not performed Extremities: No lower extremity edema. No clubbing or deformities.  Neuro: Alert and oriented x 4 , grossly normal neurologically.  Skin: Warm and dry, no rash or jaundice.   Psych: Alert and cooperative, normal mood and affect.  Labs: See above Imaging Studies: No results found.   Assessment:  H/O adenomatous colon polyps: Overdue for surveillance. Multiple tubular adenomas, largest pedunculated one in sigmoid colon, site tattooed. Denies any GI symptoms.    Plan: Colonoscopy with Dr. Abbey Chatters,  ASA II.  I have discussed the risks, alternatives, benefits with regards to but not limited to the risk of reaction to medication, bleeding, infection, perforation and the patient is agreeable to proceed. Written consent to be obtained.

## 2021-06-05 NOTE — H&P (View-Only) (Signed)
Primary Care Physician:  Nicholes Rough, PA-C  Primary Gastroenterologist:    Chief Complaint  Patient presents with   Colonoscopy    HPI:  John Carroll is a 61 y.o. male here to schedule colonoscopy at the request of Nicholes Rough, PA-C.   He is overdue for surveillance colonoscopy. Last colonoscopy 04/2017, numerous tubular adenomas removed, one measuring 27mm in the sigmoid colon was also tattooed. He was due for surveillance in 2021. He also completed EGD at that time for anemia and was noted to have moderate sized hiatal hernia, schatzki ring, gastroduodenitis. He had peptic duodenitis. Gastritis but no h.pylori.   Today: BM regular, couple of stools a day, usually postprandial. No melena. No brbpr. No abdominal pain. No dysphagia. No heartburn. No vomiting.   Labs from 04/2021: WBC 9200, Hgb 15.8, MCV 99, Platelets 262000. BUN 98, Cre 1.23, sodium 140, alb 4.4, Tbili 0.6, AP 136, AST 29, ALT 21. Glucose 98.  Current Outpatient Medications  Medication Sig Dispense Refill   acetaminophen (TYLENOL) 500 MG tablet Take 1 tablet (500 mg total) by mouth every 6 (six) hours as needed. 30 tablet 0   allopurinol (ZYLOPRIM) 100 MG tablet Take 100 mg by mouth daily.     ALPRAZolam (XANAX) 0.5 MG tablet Take 0.5 mg by mouth at bedtime as needed for anxiety.     colchicine 0.6 MG tablet Take 0.6 mg by mouth daily.     feeding supplement (BOOST / RESOURCE BREEZE) LIQD Take 1 Container by mouth 3 (three) times daily between meals. 90 Container 0   lisinopril-hydrochlorothiazide (ZESTORETIC) 20-12.5 MG tablet Take 1 tablet by mouth daily.     Multiple Vitamin (MULTIVITAMIN WITH MINERALS) TABS tablet Take 1 tablet by mouth daily.     potassium chloride (KLOR-CON) 10 MEQ tablet Take 1 tablet (10 mEq total) by mouth daily for 5 days. 5 tablet 0   triamcinolone (KENALOG) 0.025 % ointment Apply 1 application topically 2 (two) times daily. (Patient not taking: Reported on 06/05/2021) 30 g 0   No current  facility-administered medications for this visit.    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     Past Surgical History:  Procedure Laterality Date   BIOPSY  04/05/2017   Procedure: BIOPSY;  Surgeon: Danie Binder, MD;  [facility];  Service: Endoscopy;;  duodenum   COLONOSCOPY WITH PROPOFOL N/A 04/05/2017   Procedure: COLONOSCOPY WITH PROPOFOL;  Surgeon: Danie Binder, MD;  [facility];  Service: Endoscopy;  Laterality: N/A;  8:30am   ESOPHAGOGASTRODUODENOSCOPY (EGD) WITH PROPOFOL N/A 04/05/2017   Procedure: ESOPHAGOGASTRODUODENOSCOPY (EGD) WITH PROPOFOL;  Surgeon: Danie Binder, MD;  [facility];  Service: Endoscopy;  Laterality: N/A;   POLYPECTOMY  04/05/2017   Procedure: POLYPECTOMY;  Surgeon: Danie Binder, MD;  [facility];  Service: Endoscopy;;  colon    Family History  Problem Relation Age of Onset   Kidney disease Mother    Hypertension Mother    Diabetes Mellitus II Mother    Heart attack Father 44  Died of MI in [place] attack Sister 74       open heart surgery, [place].   Hypertension Sister    Colon cancer Neg Hx     Social History   Socioeconomic History   Marital status: Divorced    Spouse name: Not on file   Number of children: Not on file   Years of education: Not on file   Highest education level: Not on file  Occupational History   Occupation: disabled  Tobacco Use   Smoking status: Every Day    Packs/day: 0.50    Years: 27.00    Pack years: 13.50    Types: Cigarettes   Smokeless tobacco: Never  Vaping Use   Vaping Use: Never used  Substance and Sexual Activity    Alcohol use: Yes    Comment: occasionally, couple times per year. Used to drink real heavy years ago.(06/05/21)   Drug use: Yes    Types: Marijuana    Comment: occasional use, last use maybe a couple of weeks ago   Sexual activity: Not Currently    Birth control/protection: None  Other Topics Concern   Not on file  Social History Narrative   Not on file   Social Determinants of Health   Financial Resource Strain: Not on file  Food Insecurity: Not on file  Transportation Needs: Not on file  Physical Activity: Not on file  Stress: Not on file  Social Connections: Not on file  Intimate Partner Violence: Not on file      ROS:  General: Negative for anorexia, weight loss, fever, chills, fatigue, weakness. Eyes: Negative for vision changes.  ENT: Negative for hoarseness, difficulty swallowing , nasal congestion. CV: Negative for chest pain, angina, palpitations, dyspnea on exertion, peripheral edema.  Respiratory: Negative for dyspnea at rest, dyspnea on exertion, cough, sputum, wheezing.  GI: See history of present illness. GU:  Negative for dysuria, hematuria, urinary incontinence, urinary frequency, nocturnal urination.  MS: Negative for joint pain, low back pain.  Derm: Negative for rash or itching.  Neuro: Negative for weakness, abnormal sensation, seizure, frequent headaches, memory loss, confusion.  Psych: Negative for depression, suicidal ideation, hallucinations. +anxiety Endo: Negative for unusual weight change.  Heme: Negative for bruising or bleeding. Allergy: Negative for rash or hives.    Physical Examination:  BP (!) 148/99    Pulse 97    Temp 97.7 F (36.5 C)    Ht 5\' 8"  (1.727 m)    Wt 168 lb 12.8 oz (76.6 kg)    BMI 25.67 kg/m    General: Well-nourished, well-developed in no acute distress.  Head: Normocephalic, atraumatic.   Eyes: Conjunctiva pink, no icterus. Mouth: masked. Neck: Supple without thyromegaly, masses, or lymphadenopathy.  Lungs: Clear to  auscultation bilaterally.  Heart: Regular rate and rhythm, no murmurs rubs or gallops.  Abdomen: Bowel sounds are normal, nontender, nondistended, no hepatosplenomegaly or masses, no abdominal bruits or    hernia , no rebound or guarding.   Rectal: not performed Extremities: No lower extremity edema. No clubbing or deformities.  Neuro: Alert and oriented x 4 , grossly normal neurologically.  Skin: Warm and dry, no rash or jaundice.   Psych: Alert and cooperative, normal mood and affect.  Labs: See above Imaging Studies: No results found.   Assessment:  H/O adenomatous colon polyps: Overdue for surveillance. Multiple tubular adenomas, largest pedunculated one in sigmoid colon, site tattooed. Denies any GI symptoms.    Plan: Colonoscopy with Dr. Abbey Chatters,  ASA II.  I have discussed the risks, alternatives, benefits with regards to but not limited to the risk of reaction to medication, bleeding, infection, perforation and the patient is agreeable to proceed. Written consent to be obtained.

## 2021-06-06 ENCOUNTER — Encounter: Payer: Self-pay | Admitting: Gastroenterology

## 2021-06-08 ENCOUNTER — Telehealth: Payer: Self-pay | Admitting: *Deleted

## 2021-06-08 DIAGNOSIS — Z86010 Personal history of colonic polyps: Secondary | ICD-10-CM

## 2021-06-08 NOTE — Telephone Encounter (Signed)
LMOVM to call back to schedule TCS with Dr, Abbey Chatters, ASA 2. Will need BMET prior

## 2021-06-10 NOTE — Telephone Encounter (Signed)
Letter mailed

## 2021-06-15 NOTE — Addendum Note (Signed)
Addended by: Cheron Every on: 06/15/2021 11:42 AM   Modules accepted: Orders

## 2021-06-15 NOTE — Telephone Encounter (Signed)
Patient called in. He has been scheduled for 2/21 at 12:30pm. He will come by office and pick up instructions with prep sample. He is also aware needs labs done prior. He will have done same day he pick his prep up

## 2021-06-17 ENCOUNTER — Other Ambulatory Visit (HOSPITAL_COMMUNITY)
Admission: RE | Admit: 2021-06-17 | Discharge: 2021-06-17 | Disposition: A | Discharge status: Home / Self Care | Payer: Medicaid Other | Source: Ambulatory Visit | Attending: Internal Medicine | Admitting: Internal Medicine

## 2021-06-17 DIAGNOSIS — Z86010 Personal history of colonic polyps: Secondary | ICD-10-CM | POA: Diagnosis present

## 2021-06-17 LAB — BASIC METABOLIC PANEL
Anion gap: 15 (ref 5–15)
BUN: 10 mg/dL (ref 8–23)
CO2: 26 mmol/L (ref 22–32)
Calcium: 9.3 mg/dL (ref 8.9–10.3)
Chloride: 98 mmol/L (ref 98–111)
Creatinine, Ser: 1.21 mg/dL (ref 0.61–1.24)
GFR, Estimated: >60 mL/min (ref >60)
Glucose, Bld: 117 mg/dL — ABNORMAL HIGH (ref 70–99)
Potassium: 3.6 mmol/L (ref 3.5–5.1)
Sodium: 139 mmol/L (ref 135–145)

## 2021-06-23 ENCOUNTER — Ambulatory Visit (HOSPITAL_BASED_OUTPATIENT_CLINIC_OR_DEPARTMENT_OTHER): Payer: Medicaid Other | Admitting: Anesthesiology

## 2021-06-23 ENCOUNTER — Ambulatory Visit (HOSPITAL_COMMUNITY)
Admission: RE | Admit: 2021-06-23 | Discharge: 2021-06-23 | Disposition: A | Discharge status: Home / Self Care | Payer: Medicaid Other | Attending: Internal Medicine | Admitting: Internal Medicine

## 2021-06-23 ENCOUNTER — Ambulatory Visit (HOSPITAL_COMMUNITY): Payer: Medicaid Other | Admitting: Anesthesiology

## 2021-06-23 ENCOUNTER — Other Ambulatory Visit: Payer: Self-pay

## 2021-06-23 ENCOUNTER — Encounter (HOSPITAL_COMMUNITY): Payer: Self-pay

## 2021-06-23 ENCOUNTER — Encounter (HOSPITAL_COMMUNITY)
Admission: RE | Disposition: A | Discharge status: Home / Self Care | Payer: Self-pay | Source: Home / Self Care | Attending: Internal Medicine

## 2021-06-23 DIAGNOSIS — F419 Anxiety disorder, unspecified: Secondary | ICD-10-CM | POA: Diagnosis not present

## 2021-06-23 DIAGNOSIS — D122 Benign neoplasm of ascending colon: Secondary | ICD-10-CM | POA: Diagnosis not present

## 2021-06-23 DIAGNOSIS — D649 Anemia, unspecified: Secondary | ICD-10-CM | POA: Diagnosis not present

## 2021-06-23 DIAGNOSIS — D759 Disease of blood and blood-forming organs, unspecified: Secondary | ICD-10-CM | POA: Insufficient documentation

## 2021-06-23 DIAGNOSIS — F17210 Nicotine dependence, cigarettes, uncomplicated: Secondary | ICD-10-CM | POA: Diagnosis not present

## 2021-06-23 DIAGNOSIS — K648 Other hemorrhoids: Secondary | ICD-10-CM | POA: Insufficient documentation

## 2021-06-23 DIAGNOSIS — Z86010 Personal history of colonic polyps: Secondary | ICD-10-CM

## 2021-06-23 DIAGNOSIS — K635 Polyp of colon: Secondary | ICD-10-CM

## 2021-06-23 DIAGNOSIS — Z09 Encounter for follow-up examination after completed treatment for conditions other than malignant neoplasm: Secondary | ICD-10-CM | POA: Diagnosis not present

## 2021-06-23 DIAGNOSIS — D123 Benign neoplasm of transverse colon: Secondary | ICD-10-CM | POA: Diagnosis not present

## 2021-06-23 DIAGNOSIS — N289 Disorder of kidney and ureter, unspecified: Secondary | ICD-10-CM | POA: Insufficient documentation

## 2021-06-23 DIAGNOSIS — I10 Essential (primary) hypertension: Secondary | ICD-10-CM | POA: Insufficient documentation

## 2021-06-23 HISTORY — PX: COLONOSCOPY WITH PROPOFOL: SHX5780

## 2021-06-23 HISTORY — PX: POLYPECTOMY: SHX149

## 2021-06-23 SURGERY — COLONOSCOPY WITH PROPOFOL
Anesthesia: General

## 2021-06-23 MED ORDER — LIDOCAINE HCL (CARDIAC) PF 100 MG/5ML IV SOSY
PREFILLED_SYRINGE | INTRAVENOUS | Status: DC | PRN
  Administered 2021-06-23: 60 mg via INTRATRACHEAL

## 2021-06-23 MED ORDER — LACTATED RINGERS IV SOLN: INTRAVENOUS | Status: DC

## 2021-06-23 MED ORDER — PROPOFOL 10 MG/ML IV BOLUS
INTRAVENOUS | Status: DC | PRN
  Administered 2021-06-23: 100 mg via INTRAVENOUS

## 2021-06-23 MED ORDER — LACTATED RINGERS IV SOLN: INTRAVENOUS | Status: DC | PRN

## 2021-06-23 MED ORDER — PROPOFOL 500 MG/50ML IV EMUL
INTRAVENOUS | Status: DC | PRN
  Administered 2021-06-23: 125 ug/kg/min via INTRAVENOUS

## 2021-06-23 NOTE — Anesthesia Postprocedure Evaluation (Signed)
Anesthesia Post Note  Patient: John Carroll  Procedure(s) Performed: COLONOSCOPY WITH PROPOFOL POLYPECTOMY INTESTINAL  Patient location during evaluation: Phase II Anesthesia Type: General Level of consciousness: awake and alert and oriented Pain management: pain level controlled Vital Signs Assessment: post-procedure vital signs reviewed and stable Respiratory status: spontaneous breathing, nonlabored ventilation and respiratory function stable Cardiovascular status: blood pressure returned to baseline and stable Postop Assessment: no apparent nausea or vomiting Anesthetic complications: no   No notable events documented.   Last Vitals:  Vitals:   06/23/21 1123 06/23/21 1127  BP:  110/68  Pulse: 82 80  Resp: 20 18  Temp: 36.6 C   SpO2: 97% 98%    Last Pain:  Vitals:   06/23/21 1127  TempSrc:   PainSc: 0-No pain                 Davaun Quintela C Velta [place]

## 2021-06-23 NOTE — Transfer of Care (Signed)
Immediate Anesthesia Transfer of Care Note  Patient: John Carroll  Procedure(s) Performed: COLONOSCOPY WITH PROPOFOL POLYPECTOMY INTESTINAL  Patient Location: Short Stay  Anesthesia Type:General  Level of Consciousness: sedated  Airway & Oxygen Therapy: Patient Spontanous Breathing  Post-op Assessment: Report given to RN and Post -op Vital signs reviewed and stable  Post vital signs: Reviewed and stable  Last Vitals:  Vitals Value Taken Time  BP    Temp    Pulse    Resp    SpO2      Last Pain:  Vitals:   06/23/21 1107  TempSrc:   PainSc: 0-No pain      Patients Stated Pain Goal: 5 (20/81/38 8719)  Complications: No notable events documented.

## 2021-06-23 NOTE — Interval H&P Note (Signed)
History and Physical Interval Note:  06/23/2021 10:36 AM  John Carroll  has presented today for surgery, with the diagnosis of HX POLYPS.  The various methods of treatment have been discussed with the patient and family. After consideration of risks, benefits and other options for treatment, the patient has consented to  Procedure(s) with comments: COLONOSCOPY WITH PROPOFOL (N/A) - 12:30pm as a surgical intervention.  The patient's history has been reviewed, patient examined, no change in status, stable for surgery.  I have reviewed the patient's chart and labs.  Questions were answered to the patient's satisfaction.     Eloise Harman

## 2021-06-23 NOTE — Discharge Instructions (Addendum)
Colonoscopy Discharge Instructions  Read the instructions outlined below and refer to this sheet in the next few weeks. These discharge instructions provide you with general information on caring for yourself after you leave the hospital. Your doctor may also give you specific instructions. While your treatment has been planned according to the most current medical practices available, unavoidable complications occasionally occur.   ACTIVITY You may resume your regular activity, but move at a slower pace for the next 24 hours.  Take frequent rest periods for the next 24 hours.  Walking will help get rid of the air and reduce the bloated feeling in your belly (abdomen).  No driving for 24 hours (because of the medicine (anesthesia) used during the test).   Do not sign any important legal documents or operate any machinery for 24 hours (because of the anesthesia used during the test).  NUTRITION Drink plenty of fluids.  You may resume your normal diet as instructed by your doctor.  Begin with a light meal and progress to your normal diet. Heavy or fried foods are harder to digest and may make you feel sick to your stomach (nauseated).  Avoid alcoholic beverages for 24 hours or as instructed.  MEDICATIONS You may resume your normal medications unless your doctor tells you otherwise.  WHAT YOU CAN EXPECT TODAY Some feelings of bloating in the abdomen.  Passage of more gas than usual.  Spotting of blood in your stool or on the toilet paper.  IF YOU HAD POLYPS REMOVED DURING THE COLONOSCOPY: No aspirin products for 7 days or as instructed.  No alcohol for 7 days or as instructed.  Eat a soft diet for the next 24 hours.  FINDING OUT THE RESULTS OF YOUR TEST Not all test results are available during your visit. If your test results are not back during the visit, make an appointment with your caregiver to find out the results. Do not assume everything is normal if you have not heard from your  caregiver or the medical facility. It is important for you to follow up on all of your test results.  SEEK IMMEDIATE MEDICAL ATTENTION IF: You have more than a spotting of blood in your stool.  Your belly is swollen (abdominal distention).  You are nauseated or vomiting.  You have a temperature over 101.  You have abdominal pain or discomfort that is severe or gets worse throughout the day.   Your colonoscopy revealed 3 polyp(s) which I removed successfully. Await pathology results, my office will contact you. I recommend repeating colonoscopy in 5 years for surveillance purposes. Otherwise follow up with GI as needed.    I hope you have a great rest of your week!  Elon Alas. Abbey Chatters, D.O. Gastroenterology and Hepatology [facility] PATIENT INSTRUCTIONS POST-ANESTHESIA  IMMEDIATELY FOLLOWING SURGERY:  Do not drive or operate machinery for the first twenty four hours after surgery.  Do not make any important decisions for twenty four hours after surgery or while taking narcotic pain medications or sedatives.  If you develop intractable nausea and vomiting or a severe headache please notify your doctor immediately.  FOLLOW-UP:  Please make an appointment with your surgeon as instructed. You do not need to follow up with anesthesia unless specifically instructed to do so.  WOUND CARE INSTRUCTIONS (if applicable):  Keep a dry clean dressing on the anesthesia/puncture wound site if there is drainage.  Once the wound has quit draining you may leave it open to air.  Generally you should  leave the bandage intact for twenty four hours unless there is drainage.  If the epidural site drains for more than 36-48 hours please call the anesthesia department.  QUESTIONS?:  Please feel free to call your physician or the hospital operator if you have any questions, and they will be happy to assist you.

## 2021-06-23 NOTE — Anesthesia Preprocedure Evaluation (Addendum)
Anesthesia Evaluation  Patient identified by MRN, date of birth, ID band Patient awake    Reviewed: Allergy & Precautions, NPO status , Patient's Chart, lab work & pertinent test results  Airway Mallampati: II  TM Distance: >3 FB Neck ROM: Full    Dental  (+) Dental Advisory Given, Missing   Pulmonary Current Smoker and Patient abstained from smoking.,    Pulmonary exam normal breath sounds clear to auscultation       Cardiovascular Exercise Tolerance: Good hypertension, Pt. on medications Normal cardiovascular exam Rhythm:Regular Rate:Normal     Neuro/Psych PSYCHIATRIC DISORDERS Anxiety negative neurological ROS     GI/Hepatic GERD  ,(+)     substance abuse  alcohol use and marijuana use,   Endo/Other  negative endocrine ROS  Renal/GU Renal InsufficiencyRenal disease  negative genitourinary   Musculoskeletal negative musculoskeletal ROS (+)   Abdominal   Peds negative pediatric ROS (+)  Hematology  (+) Blood dyscrasia, anemia ,   Anesthesia Other Findings   Reproductive/Obstetrics negative OB ROS                            Anesthesia Physical Anesthesia Plan  ASA: 2  Anesthesia Plan: General   Post-op Pain Management: Minimal or no pain anticipated   Induction: Intravenous  PONV Risk Score and Plan: TIVA  Airway Management Planned: Nasal Cannula and Natural Airway  Additional Equipment:   Intra-op Plan:   Post-operative Plan:   Informed Consent: I have reviewed the patients History and Physical, chart, labs and discussed the procedure including the risks, benefits and alternatives for the proposed anesthesia with the patient or authorized representative who has indicated his/her understanding and acceptance.     Dental advisory given  Plan Discussed with: CRNA and Surgeon  Anesthesia Plan Comments:         Anesthesia Quick Evaluation

## 2021-06-23 NOTE — Op Note (Addendum)
[facility] Patient Name: John Carroll Procedure Date: 06/23/2021 11:00 AM MRN: [id number] Date of Birth: 1961-01-12 Attending MD: Elon Alas. Abbey Chatters DO CSN: [id number] Age: 61 Admit Type: Outpatient Procedure:                Colonoscopy Indications:              High risk colon cancer surveillance: Personal                            history of colonic polyps Providers:                Elon Alas. Abbey Chatters, DO, Crystal Page, Raphael Gibney,                            Technician Referring MD:              Medicines:                See the Anesthesia note for documentation of the                            administered medications Complications:            No immediate complications. Estimated Blood Loss:     Estimated blood loss was minimal. Procedure:                Pre-Anesthesia Assessment:                           - The anesthesia plan was to use monitored                            anesthesia care (MAC).                           After obtaining informed consent, the colonoscope                            was passed under direct vision. Throughout the                            procedure, the patient's blood pressure, pulse, and                            oxygen saturations were monitored continuously. The                            [id number]-HQ190L ([id number]) scope was introduced through                            the anus and advanced to the the cecum, identified                            by appendiceal orifice and ileocecal valve. The                            colonoscopy was performed without difficulty. The  patient tolerated the procedure well. The quality                            of the bowel preparation was evaluated using the                            BBPS [facility] Bowel Preparation Scale) with scores                            of: Right Colon = 3, Transverse Colon = 3 and Left                            Colon = 3 (entire mucosa seen well with  no residual                            staining, small fragments of stool or opaque                            liquid). The total BBPS score equals 9. Scope In: 11:11:00 AM Scope Out: 11:19:43 AM Scope Withdrawal Time: 0 hours 7 minutes 7 seconds  Total Procedure Duration: 0 hours 8 minutes 43 seconds  Findings:      The perianal and digital rectal examinations were normal.      Non-bleeding internal hemorrhoids were found during endoscopy.      Two sessile polyps were found in the ascending colon. The polyps were 4       to 6 mm in size. These polyps were removed with a cold snare. Resection       and retrieval were complete.      A 4 mm polyp was found in the transverse colon. The polyp was sessile.       The polyp was removed with a cold snare. Resection and retrieval were       complete.      A tattoo was seen in the sigmoid colon. The tattoo site appeared normal. Impression:               - Non-bleeding internal hemorrhoids.                           - Two 4 to 6 mm polyps in the ascending colon,                            removed with a cold snare. Resected and retrieved.                           - One 4 mm polyp in the transverse colon, removed                            with a cold snare. Resected and retrieved.                           - A tattoo was seen in the sigmoid colon. The  tattoo site appeared normal. Moderate Sedation:      Per Anesthesia Care Recommendation:           - Patient has a contact number available for                            emergencies. The signs and symptoms of potential                            delayed complications were discussed with the                            patient. Return to normal activities tomorrow.                            Written discharge instructions were provided to the                            patient.                           - Resume previous diet.                           - Continue present  medications.                           - Await pathology results.                           - Repeat colonoscopy in 5 years for surveillance.                           - Return to GI clinic PRN. Procedure Code(s):        --- Professional ---                           [phone], Colonoscopy, flexible; with removal of                            tumor(s), polyp(s), or other lesion(s) by snare                            technique Diagnosis Code(s):        --- Professional ---                           K63.5, Polyp of colon                           Z86.010, Personal history of colonic polyps                           K64.8, Other hemorrhoids CPT copyright 2019 American Medical Association. All rights reserved. The codes documented in this report are preliminary and upon coder review may  be revised to meet current compliance requirements. Elon Alas. Abbey Chatters, DO [place] Abbey Chatters, DO 06/23/2021  11:22:44 AM This report has been signed electronically. Number of Addenda: 0

## 2021-06-25 LAB — SURGICAL PATHOLOGY

## 2021-06-26 ENCOUNTER — Encounter (HOSPITAL_COMMUNITY): Payer: Self-pay | Admitting: Internal Medicine

## 2023-10-05 ENCOUNTER — Ambulatory Visit: Admission: EM | Admit: 2023-10-05 | Discharge: 2023-10-05 | Disposition: A | Discharge status: Home / Self Care

## 2023-10-05 ENCOUNTER — Encounter: Payer: Self-pay | Admitting: Emergency Medicine

## 2023-10-05 DIAGNOSIS — M109 Gout, unspecified: Secondary | ICD-10-CM

## 2023-10-05 DIAGNOSIS — M10472 Other secondary gout, left ankle and foot: Secondary | ICD-10-CM

## 2023-10-05 MED ORDER — PREDNISONE 10 MG (21) PO TBPK: ORAL_TABLET | ORAL | 0 refills | Status: AC

## 2023-10-05 NOTE — ED Triage Notes (Signed)
 Left foot pain x 3 days around great toe are.  Area and red and swollen.  States he is out of his colchicine.

## 2023-10-05 NOTE — ED Provider Notes (Signed)
 [facility]    CSN: [id number] Arrival date & time: 10/05/23  1709      History   Chief Complaint No chief complaint on file.   HPI John Carroll is a 62 y.o. male.   Patient presents today with a 3-day history of worsening left first toe pain.  He reports that last week he was at the beach with his family and was walking a lot more than his normal.  He does not remember injuring himself or falling.  He reports that gradually the pain has been worsening and is currently rated 9 on a 0-10 pain scale, localized to the base of his left great toe, described as throbbing, worse with palpation, no alleviating factors identified.  He does have a history of gout with similar presentation.  Reports that he did not eat a lot of seafood while at the beach but did have an increase in alcohol consumption and wonders if this could have triggered his symptoms.  He denies any fever, nausea, vomiting.  He has not been taking any over-the-counter medications for symptom management as he reports generally he requires a course of steroids to manage the symptoms.  Denies any history of diabetes.    Past Medical History:  Diagnosis Date   Acute renal failure (ARF) (HCC) 5/18, 11/18   Anxiety    Chronic back pain    Epididymitis    GERD (gastroesophageal reflux disease)    Gout    Hypertension    stopped medications due to low BPs   Pancreatitis 08/2016   idiopathic   Pancreatitis 04/2019   Alcohol induced     Patient Active Problem List   Diagnosis Date Noted   H/O adenomatous polyp of colon 06/05/2021   Normocytic anemia    Failure to thrive in adult 03/24/2017   Hypotension due to hypovolemia    Unexplained weight loss 03/23/2017   Pancreatitis 09/23/2016   Marijuana abuse 09/23/2016   Tobacco abuse 09/23/2016   Hyperglycemia 09/23/2016   AKI (acute kidney injury) (HCC) 03/29/2016   Acute renal failure (ARF) (HCC) 03/28/2016   Hyperkalemia 03/28/2016   Hypertension  03/28/2016   Hyponatremia 03/28/2016    Past Surgical History:  Procedure Laterality Date   BIOPSY  04/05/2017   Procedure: BIOPSY;  Surgeon: Alyce Jubilee, MD;  [facility]: [facility];  Service: Endoscopy;;  duodenum   COLONOSCOPY WITH PROPOFOL  N/A 04/05/2017   Procedure: COLONOSCOPY WITH PROPOFOL ;  Surgeon: Alyce Jubilee, MD;  Location: [facility];  Service: Endoscopy;  Laterality: N/A;  8:30am   COLONOSCOPY WITH PROPOFOL  N/A 06/23/2021   Procedure: COLONOSCOPY WITH PROPOFOL ;  Surgeon: Vinetta Greening, DO;  Location: [facility];  Service: Endoscopy;  Laterality: N/A;  12:30pm   ESOPHAGOGASTRODUODENOSCOPY (EGD) WITH PROPOFOL  N/A 04/05/2017   Procedure: ESOPHAGOGASTRODUODENOSCOPY (EGD) WITH PROPOFOL ;  Surgeon: Alyce Jubilee, MD;  Location: [facility];  Service: Endoscopy;  Laterality: N/A;   POLYPECTOMY  04/05/2017   Procedure: POLYPECTOMY;  Surgeon: Alyce Jubilee, MD;  Location: [facility];  Service: Endoscopy;;  colon   POLYPECTOMY  06/23/2021   Procedure: POLYPECTOMY INTESTINAL;  Surgeon: Vinetta Greening, DO;  Location: [facility];  Service: Endoscopy;;       Home Medications    Prior to Admission medications   Medication Sig Start Date End Date Taking? Authorizing Provider  predniSONE  (STERAPRED UNI-PAK 21 TAB) 10 MG (21) TBPK tablet As directed 10/05/23  Yes Keiden Deskin, Betsey Brow, PA-C  rosuvastatin (CRESTOR) 5 MG tablet Take 5 mg by mouth daily.   Yes [provider]  acetaminophen  (TYLENOL ) 500 MG tablet Take 500-1,000 mg by mouth every 6 (six) hours as needed for mild pain or headache.    [provider]  allopurinol (ZYLOPRIM) 100 MG tablet Take 100 mg by mouth daily.    [provider]  ALPRAZolam (XANAX) 0.5 MG tablet Take 0.5 mg by mouth 3 (three) times daily as needed for anxiety.    [provider]  colchicine 0.6 MG tablet Take 0.6 mg by mouth daily as needed (gout flare).    [provider]  feeding supplement  (BOOST / RESOURCE BREEZE) LIQD Take 1 Container by mouth daily with breakfast.    [provider]  lisinopril-hydrochlorothiazide (ZESTORETIC) 20-12.5 MG tablet Take 1 tablet by mouth daily.    [provider]  Multiple Vitamin (MULTIVITAMIN WITH MINERALS) TABS tablet Take 1 tablet by mouth at bedtime.    [provider]    Family History Family History  Problem Relation Age of Onset   Kidney disease Mother    Hypertension Mother    Diabetes Mellitus II Mother    Heart attack Father 86       Died of MI in 39   Heart attack Sister 28       open heart surgery, remot.   Hypertension Sister    Colon cancer Neg Hx     Social History Social History   Tobacco Use   Smoking status: Every Day    Current packs/day: 0.50    Average packs/day: 0.5 packs/day for 27.0 years (13.5 ttl pk-yrs)    Types: Cigarettes   Smokeless tobacco: Never  Vaping Use   Vaping status: Never Used  Substance Use Topics   Alcohol use: Yes    Comment: occasionally, couple times per year. Used to drink real heavy years ago.(06/05/21)   Drug use: Yes    Types: Marijuana    Comment: occasional use, last use maybe a couple of weeks ago     Allergies   Patient has no known allergies.   Review of Systems Review of Systems  Constitutional:  Positive for activity change. Negative for appetite change, fatigue and fever.  Gastrointestinal:  Negative for nausea and vomiting.  Musculoskeletal:  Positive for arthralgias, gait problem and joint swelling. Negative for myalgias.  Skin:  Positive for color change. Negative for wound.  Neurological:  Negative for weakness and numbness.     Physical Exam Triage Vital Signs ED Triage Vitals  Encounter Vitals Group     BP 10/05/23 1715 (!) 168/93     Systolic BP Percentile --      Diastolic BP Percentile --      Pulse Rate 10/05/23 1716 (!) 106     Resp 10/05/23 1715 16     Temp 10/05/23 1715 98.7 F (37.1 C)     Temp Source  10/05/23 1715 Oral     SpO2 10/05/23 1715 92 %     Weight --      Height --      Head Circumference --      Peak Flow --      Pain Score 10/05/23 1717 9     Pain Loc --      Pain Education --      Exclude from Growth Chart --    No data found.  Updated Vital Signs BP (!) 168/93 (BP Location: Right Arm)   Pulse  98   Temp 98.7 F (37.1 C) (Oral)   Resp 16   SpO2 94%   Visual Acuity Right Eye Distance:   Left Eye Distance:   Bilateral Distance:    Right Eye Near:   Left Eye Near:    Bilateral Near:     Physical Exam Vitals reviewed.  Constitutional:      General: He is awake.     Appearance: Normal appearance. He is well-developed. He is not ill-appearing.     Comments: Very pleasant male appears stated age in no acute distress sitting comfortably in exam room  HENT:     Head: Normocephalic and atraumatic.     Mouth/Throat:     Pharynx: No oropharyngeal exudate, posterior oropharyngeal erythema or uvula swelling.  Cardiovascular:     Rate and Rhythm: Normal rate and regular rhythm.     Heart sounds: Normal heart sounds, S1 normal and S2 normal. No murmur heard.    Comments: Capillary refill within 2 seconds left toes Pulmonary:     Effort: Pulmonary effort is normal.     Breath sounds: No stridor. Wheezing present. No rhonchi or rales.     Comments: Scattered wheezing Abdominal:     General: Bowel sounds are normal.     Palpations: Abdomen is soft.     Tenderness: There is no abdominal tenderness.  Musculoskeletal:     Left foot: Normal range of motion. No deformity.       Feet:  Feet:     Left foot:     Protective Sensation: 10 sites tested.  10 sites sensed.     Skin integrity: Erythema present. No ulcer, blister or skin breakdown.     Toenail Condition: Left toenails are normal.     Comments: Left foot: No wound on exam.  Foot is neurovascularly intact.  Significant tenderness palpation with even light touch noted at first MTP joint with associated warmth  and swelling.  No streaking or evidence of lymphangitis.  No bleeding or drainage noted. Neurological:     Mental Status: He is alert.  Psychiatric:        Behavior: Behavior is cooperative.      UC Treatments / Results  Labs (all labs ordered are listed, but only abnormal results are displayed) Labs Reviewed - No data to display  EKG   Radiology No results found.  Procedures Procedures (including critical care time)  Medications Ordered in UC Medications - No data to display  Initial Impression / Assessment and Plan / UC Course  I have reviewed the triage vital signs and the nursing notes.  Pertinent labs & imaging results that were available during my care of the patient were reviewed by me and considered in my medical decision making (see chart for details).     Patient is well-appearing, afebrile, nontoxic.  He was initially mildly tachycardic but this improved after he sat quietly for several minutes and believes that his elevated heart rate and elevated blood pressure are related to the pain.  Symptoms are consistent with gout.  Low suspicion for septic arthritis as he denies any fever or systemic symptoms and states current symptoms are identical to previous episodes of gout.  He generally uses prednisone  and so we will start this medication as this would help with both his gout as well as wheezing on exam which is likely related to tobacco abuse according to patient.  Denies any shortness of breath, palpitations, chest pain.  We discussed that he is not  to take NSAIDs with this medication.  He can use Tylenol  for breakthrough pain.  Recommend close follow-up with his primary care.  We discussed that if anything worsens or changes and he has spreading redness, increasing pain, fever, nausea, vomiting he needs to be seen immediately.  Strict return precautions given.  Final Clinical Impressions(s) / UC Diagnoses   Final diagnoses:  Acute gout due to other secondary cause  involving toe of left foot     Discharge Instructions      We are treating you for gout.  Start prednisone  as prescribed.  Do not take NSAIDs with this medication including aspirin, ibuprofen/Advil, naproxen /Aleve .  You can use Tylenol  for breakthrough pain.  Follow-up with your primary care within the week.  If anything worsens and you have worsening pain, worsening redness, wound, fever, nausea, vomiting, numbness or tingling in your foot you should be seen immediately.   ED Prescriptions     Medication Sig Dispense Auth. Provider   predniSONE  (STERAPRED UNI-PAK 21 TAB) 10 MG (21) TBPK tablet As directed 21 tablet Virdell Hoiland K, PA-C      PDMP not reviewed this encounter.   Budd Cargo, PA-C 10/05/23 1738

## 2023-10-05 NOTE — Discharge Instructions (Signed)
 We are treating you for gout.  Start prednisone  as prescribed.  Do not take NSAIDs with this medication including aspirin, ibuprofen/Advil, naproxen /Aleve .  You can use Tylenol  for breakthrough pain.  Follow-up with your primary care within the week.  If anything worsens and you have worsening pain, worsening redness, wound, fever, nausea, vomiting, numbness or tingling in your foot you should be seen immediately.
